# Patient Record
Sex: Female | Born: 1985 | Race: Black or African American | Hispanic: No | Marital: Single | Smoking: Never smoker
Health system: Southern US, Community
[De-identification: ages and names within clinical notes are randomized; demographics above are authoritative.]

---

## 2006-12-15 ENCOUNTER — Ambulatory Visit: Payer: Self-pay | Admitting: Internal Medicine

## 2007-08-08 ENCOUNTER — Ambulatory Visit: Payer: Self-pay | Admitting: Internal Medicine

## 2007-08-08 ENCOUNTER — Other Ambulatory Visit: Admission: RE | Admit: 2007-08-08 | Discharge: 2007-08-08 | Payer: Self-pay | Admitting: Internal Medicine

## 2007-08-08 ENCOUNTER — Encounter (INDEPENDENT_AMBULATORY_CARE_PROVIDER_SITE_OTHER): Payer: Self-pay | Admitting: Internal Medicine

## 2007-08-08 DIAGNOSIS — A5901 Trichomonal vulvovaginitis: Secondary | ICD-10-CM | POA: Insufficient documentation

## 2007-08-08 LAB — CONVERTED CEMR LAB
Glucose, Urine, Semiquant: NEGATIVE
Nitrite: NEGATIVE
Protein, U semiquant: 30
Urobilinogen, UA: NEGATIVE

## 2007-08-09 ENCOUNTER — Encounter (INDEPENDENT_AMBULATORY_CARE_PROVIDER_SITE_OTHER): Payer: Self-pay | Admitting: Internal Medicine

## 2007-08-09 LAB — CONVERTED CEMR LAB: Clue Cells Wet Prep HPF POC: NONE SEEN

## 2007-08-15 LAB — CONVERTED CEMR LAB
Chlamydia, DNA Probe: POSITIVE — AB
Cholesterol: 139 mg/dL (ref 0–200)
LDL Cholesterol: 71 mg/dL (ref 0–99)
Triglycerides: 43 mg/dL (ref <150)
VLDL: 9 mg/dL (ref 0–40)

## 2007-08-18 ENCOUNTER — Ambulatory Visit: Payer: Self-pay | Admitting: Internal Medicine

## 2007-08-18 DIAGNOSIS — B373 Candidiasis of vulva and vagina: Secondary | ICD-10-CM

## 2007-08-18 LAB — CONVERTED CEMR LAB: KOH Prep: NEGATIVE

## 2007-10-24 ENCOUNTER — Ambulatory Visit: Payer: Self-pay | Admitting: Internal Medicine

## 2008-01-09 ENCOUNTER — Ambulatory Visit: Payer: Self-pay | Admitting: Internal Medicine

## 2008-01-09 DIAGNOSIS — L708 Other acne: Secondary | ICD-10-CM

## 2008-01-26 ENCOUNTER — Telehealth (INDEPENDENT_AMBULATORY_CARE_PROVIDER_SITE_OTHER): Payer: Self-pay | Admitting: Internal Medicine

## 2008-02-01 ENCOUNTER — Ambulatory Visit: Payer: Self-pay | Admitting: Internal Medicine

## 2008-02-01 LAB — CONVERTED CEMR LAB: Beta hcg, urine, semiquantitative: NEGATIVE

## 2008-05-01 ENCOUNTER — Ambulatory Visit: Payer: Self-pay | Admitting: Internal Medicine

## 2008-07-26 ENCOUNTER — Ambulatory Visit: Payer: Self-pay | Admitting: Internal Medicine

## 2008-07-26 DIAGNOSIS — A184 Tuberculosis of skin and subcutaneous tissue: Secondary | ICD-10-CM | POA: Insufficient documentation

## 2008-07-29 ENCOUNTER — Ambulatory Visit: Payer: Self-pay | Admitting: Internal Medicine

## 2008-09-12 ENCOUNTER — Telehealth (INDEPENDENT_AMBULATORY_CARE_PROVIDER_SITE_OTHER): Payer: Self-pay | Admitting: Internal Medicine

## 2008-09-13 ENCOUNTER — Telehealth (INDEPENDENT_AMBULATORY_CARE_PROVIDER_SITE_OTHER): Payer: Self-pay | Admitting: Internal Medicine

## 2008-12-05 ENCOUNTER — Ambulatory Visit: Payer: Self-pay | Admitting: Internal Medicine

## 2008-12-05 LAB — CONVERTED CEMR LAB: Beta hcg, urine, semiquantitative: NEGATIVE

## 2009-04-10 ENCOUNTER — Emergency Department (HOSPITAL_COMMUNITY): Admission: EM | Admit: 2009-04-10 | Discharge: 2009-04-10 | Payer: Self-pay | Admitting: Emergency Medicine

## 2009-05-26 ENCOUNTER — Encounter (INDEPENDENT_AMBULATORY_CARE_PROVIDER_SITE_OTHER): Payer: Self-pay | Admitting: Internal Medicine

## 2009-08-15 ENCOUNTER — Ambulatory Visit: Payer: Self-pay | Admitting: Internal Medicine

## 2009-08-15 DIAGNOSIS — N949 Unspecified condition associated with female genital organs and menstrual cycle: Secondary | ICD-10-CM

## 2009-08-28 ENCOUNTER — Telehealth (INDEPENDENT_AMBULATORY_CARE_PROVIDER_SITE_OTHER): Payer: Self-pay | Admitting: Internal Medicine

## 2010-03-16 ENCOUNTER — Ambulatory Visit: Payer: Self-pay | Admitting: Internal Medicine

## 2010-03-16 DIAGNOSIS — N912 Amenorrhea, unspecified: Secondary | ICD-10-CM | POA: Insufficient documentation

## 2010-04-10 ENCOUNTER — Inpatient Hospital Stay (HOSPITAL_COMMUNITY): Admission: AD | Admit: 2010-04-10 | Discharge: 2010-04-10 | Payer: Self-pay | Admitting: Obstetrics

## 2010-04-10 ENCOUNTER — Ambulatory Visit: Payer: Self-pay | Admitting: Nurse Practitioner

## 2010-05-26 ENCOUNTER — Emergency Department (HOSPITAL_COMMUNITY): Admission: EM | Admit: 2010-05-26 | Discharge: 2010-05-26 | Payer: Self-pay | Admitting: Emergency Medicine

## 2010-06-02 ENCOUNTER — Ambulatory Visit (HOSPITAL_COMMUNITY): Admission: RE | Admit: 2010-06-02 | Discharge: 2010-06-02 | Payer: Self-pay | Admitting: Obstetrics

## 2010-09-28 ENCOUNTER — Emergency Department (HOSPITAL_COMMUNITY)
Admission: EM | Admit: 2010-09-28 | Discharge: 2010-09-28 | Payer: Self-pay | Source: Home / Self Care | Admitting: Emergency Medicine

## 2010-10-27 NOTE — Assessment & Plan Note (Signed)
Summary: PREGNANCY TEST///KT   Vital Signs:  Patient profile:   25 year old female Menstrual status:  irregular LMP:     02/17/2010 Weight:      148 pounds Temp:     97.9 degrees F Pulse rate:   85 / minute Pulse rhythm:   regular Resp:     18 per minute BP sitting:   105 / 72  (left arm) Cuff size:   regular  Vitals Entered By: Vesta Mixer CMA (March 16, 2010 3:43 PM) CC: would like pregnancy test, period is normally first if the month but last one was the end of May. Is Patient Diabetic? No Pain Assessment Patient in pain? no       Does patient need assistance? Ambulation Normal LMP (date): 02/17/2010 LMP - Character: 06/2007     Enter LMP: 02/17/2010   CC:  would like pregnancy test and period is normally first if the month but last one was the end of May..  History of Present Illness: 1.  Pt. would like a pregnancy test.  Not on any birth control other than using condoms.  States very good about using them.  Thinks perhaps a condom broke about 4 weeks ago.  Eating constantly.  Having breast tenderness.  Some cramping, but period has not started--was due end of May.  If is pregnant, plans to have child.  Pt. with current boyfriend for 5 months.  Allergies (verified): No Known Drug Allergies  Social History: Lives with Mom, Stepfather just moved out. Brothers live there as well. Soon to be moving to her own place. Has a boyfriend of 5 months.  Denies alcohol, tobacco, or drugs  Physical Exam  Abdomen:  soft, non-tender, normal bowel sounds, no distention, and no masses.  No palpably enlarge uterus at this time.   Impression & Recommendations:  Problem # 1:  AMENORRHEA (ICD-626.0)  Orders: Urine Pregnancy Test  (81191)  Positive--Prenatal vitamins and pamphlet to call OB given.  Complete Medication List: 1)  Prenatal Vitamins 0.8 Mg Tabs (Prenatal multivit-min-fe-fa) .Marland Kitchen.. 1 tab by mouth daily Prescriptions: PRENATAL VITAMINS 0.8 MG TABS (PRENATAL  MULTIVIT-MIN-FE-FA) 1 tab by mouth daily  #30 x 11   Entered and Authorized by:   Julieanne Manson MD   Signed by:   Julieanne Manson MD on 03/16/2010   Method used:   Electronically to        [facility] [phone]* (retail)       [address]       [place], [place]  [address]       Ph: [phone]       Fax: [phone]   RxID:   [id number]   Appended Document: PREGNANCY TEST///KT  Laboratory Results   Urine Tests      Urine HCG: positive

## 2010-12-11 LAB — URINALYSIS, ROUTINE W REFLEX MICROSCOPIC
Ketones, ur: NEGATIVE mg/dL
Nitrite: NEGATIVE
Protein, ur: NEGATIVE mg/dL
Specific Gravity, Urine: 1.023 (ref 1.005–1.030)
pH: 7 (ref 5.0–8.0)

## 2010-12-11 LAB — URINE MICROSCOPIC-ADD ON

## 2010-12-11 LAB — POCT PREGNANCY, URINE: Preg Test, Ur: POSITIVE

## 2010-12-12 ENCOUNTER — Inpatient Hospital Stay (HOSPITAL_COMMUNITY)
Admission: AD | Admit: 2010-12-12 | Discharge: 2010-12-12 | Disposition: A | Discharge status: Home / Self Care | Payer: Medicaid Other | Source: Ambulatory Visit | Attending: Obstetrics | Admitting: Obstetrics

## 2010-12-12 DIAGNOSIS — R109 Unspecified abdominal pain: Secondary | ICD-10-CM

## 2010-12-12 DIAGNOSIS — O98819 Other maternal infectious and parasitic diseases complicating pregnancy, unspecified trimester: Secondary | ICD-10-CM

## 2010-12-12 DIAGNOSIS — A5901 Trichomonal vulvovaginitis: Secondary | ICD-10-CM

## 2010-12-12 LAB — ABO/RH: ABO/RH(D): O POS

## 2010-12-12 LAB — WET PREP, GENITAL

## 2010-12-12 LAB — CBC
HCT: 40.2 % (ref 36.0–46.0)
MCH: 29.1 pg (ref 26.0–34.0)
MCHC: 33.1 g/dL (ref 30.0–36.0)
RDW: 12.8 % (ref 11.5–15.5)

## 2010-12-12 LAB — URINALYSIS, ROUTINE W REFLEX MICROSCOPIC

## 2010-12-12 LAB — GC/CHLAMYDIA PROBE AMP, GENITAL
GC Probe Amp, Genital: NEGATIVE
GC Probe Amp, Genital: NEGATIVE

## 2010-12-12 LAB — URINE MICROSCOPIC-ADD ON

## 2010-12-12 LAB — POCT PREGNANCY, URINE: Preg Test, Ur: POSITIVE

## 2011-01-04 LAB — POCT PREGNANCY, URINE: Preg Test, Ur: NEGATIVE

## 2011-01-10 ENCOUNTER — Inpatient Hospital Stay (HOSPITAL_COMMUNITY)
Admission: AD | Admit: 2011-01-10 | Discharge: 2011-01-10 | Disposition: A | Discharge status: Home / Self Care | Payer: Medicaid Other | Source: Ambulatory Visit | Attending: Obstetrics | Admitting: Obstetrics

## 2011-01-10 DIAGNOSIS — O479 False labor, unspecified: Secondary | ICD-10-CM | POA: Insufficient documentation

## 2011-01-15 ENCOUNTER — Inpatient Hospital Stay (HOSPITAL_COMMUNITY)
Admission: AD | Admit: 2011-01-15 | Discharge: 2011-01-18 | DRG: 765 | Disposition: A | Discharge status: Home / Self Care | Payer: Medicaid Other | Source: Ambulatory Visit | Attending: Obstetrics | Admitting: Obstetrics

## 2011-01-15 DIAGNOSIS — D62 Acute posthemorrhagic anemia: Secondary | ICD-10-CM | POA: Diagnosis not present

## 2011-01-15 DIAGNOSIS — O9903 Anemia complicating the puerperium: Secondary | ICD-10-CM | POA: Diagnosis not present

## 2011-01-15 LAB — CBC
HCT: 36.8 % (ref 36.0–46.0)
MCHC: 31.8 g/dL (ref 30.0–36.0)
MCV: 85.8 fL (ref 78.0–100.0)
RBC: 4.29 MIL/uL (ref 3.87–5.11)
RDW: 14.5 % (ref 11.5–15.5)
WBC: 16.1 10*3/uL — ABNORMAL HIGH (ref 4.0–10.5)

## 2011-01-17 LAB — CBC
HCT: 27.8 % — ABNORMAL LOW (ref 36.0–46.0)
Hemoglobin: 8.8 g/dL — ABNORMAL LOW (ref 12.0–15.0)
MCH: 27 pg (ref 26.0–34.0)
MCV: 85.3 fL (ref 78.0–100.0)
RBC: 3.26 MIL/uL — ABNORMAL LOW (ref 3.87–5.11)

## 2011-01-26 ENCOUNTER — Inpatient Hospital Stay (HOSPITAL_COMMUNITY): Admission: AD | Admit: 2011-01-26 | Payer: Self-pay | Source: Home / Self Care | Admitting: Obstetrics

## 2011-01-26 NOTE — Op Note (Signed)
Kimberly Fleming, MOLZAHN                 ACCOUNT NO.:  [id number]  MEDICAL RECORD NO.:  [id number]           PATIENT TYPE:  I  LOCATION:  9124                          [facility]ACILITY:  [facility]  PHYSICIAN:  Roseanna Rainbow, M.D.DATE OF BIRTH:  [birth year]  DATE OF PROCEDURE:  01/16/2011 DATE OF DISCHARGE:                              OPERATIVE REPORT   PREOPERATIVE DIAGNOSES:  Intrauterine pregnancy at term, arrest of descent, failed attempt at a vacuum-assisted delivery.  POSTOPERATIVE DIAGNOSES:  Intrauterine pregnancy at term, arrest of descent, failed attempt at a vacuum-assisted delivery, persistent occiput posterior.  PROCEDURE:  Primary low uterine flap elliptical cesarean delivery via transverse incision.  SURGEON:  Roseanna Rainbow, MD  ANESTHESIA:  Spinal.  FINDINGS:  Live born female, occiput posterior, Apgars 8 and 9.  ESTIMATED BLOOD LOSS:  600 mL.  URINE OUTPUT:  As per Anesthesiology, bloody.  COMPLICATIONS:  Right lateral uterine incision extension.  PROCEDURE IN DETAIL:  The patient was taken to the operating room with an IV running.  A spinal anesthetic was then administered.  The patient was then placed in the dorsal supine position with a left lateral tilt and prepped and draped in usual sterile fashion.  A transverse skin incision was then made with a scalpel and extended down to the fascia with the Bovie.  The fascia was nicked in the midline.  The fascial incision was then extended bilaterally with curved Mayo scissors.  The superior aspect of the fascial incision was tented up and the underlying rectus muscles dissected off.  The inferior aspect of the fascial incision was manipulated in a similar fashion.  The rectus muscles were separated in the midline.  The parietal peritoneum was tented up and entered sharply.  This incision was then extended superiorly and inferiorly with good visualization of the bladder.  The bladder blade was then placed.  The  vesicouterine peritoneum was tented up and entered sharply.  This incision was then extended bilaterally and the bladder flap created bluntly.  The bladder blade was then replaced.  The lower uterine segment was incised in a transverse fashion with the scalpel. The shoulders were then elevated and with assistance from below, the infant's head was delivered atraumatically.  The cord was clamped.  The infant was handed off to the awaiting neonatologist.  The placenta was then removed.  The intrauterine cavity was evacuated of any remaining amniotic fluid clots and debris with a moistened laparotomy sponge.  The uterine incision was then reapproximated in a running interlocking fashion using a suture of 0 Monocryl.  The extension noted above was approximately 2 cm, and it was repaired separately and imbricated as well.  The remainder of the uterine incision was imbricated with a suture of 0 Monocryl.  Adequate hemostasis was noted.  The paracolic gutters were then irrigated.  The parietal peritoneum was reapproximated in a running fashion using a suture of 2-0 Vicryl.  The fascia was reapproximated with 2 running sutures of 0 Vicryl tied in the midline.  The skin was closed with staples.  At the close of the procedure, the instrument and pack counts  were said to be correct x2.  The patient was taken to the PACU in awake and stable condition.     Roseanna Rainbow, M.D.     Judee Clara  D:  01/16/2011  T:  01/17/2011  Job:  [id number]  Electronically Signed by Antionette Char M.D. on 01/26/2011 03:30:41 PM

## 2011-01-26 NOTE — H&P (Signed)
  NAMEZELENA, Kimberly Fleming                 ACCOUNT NO.:  [id number]  MEDICAL RECORD NO.:  [id number]           PATIENT TYPE:  I  LOCATION:  9124                          [facility]  [facility]  PHYSICIAN:  Roseanna Rainbow, M.D.DATE OF BIRTH:  12/29/1985  DATE OF ADMISSION:  01/15/2011 DATE OF DISCHARGE:                             HISTORY & PHYSICAL   CHIEF COMPLAINT:  The patient is a 25 year old gravida 1, para 0 with an intrauterine pregnancy at 39 plus weeks complaining of contractions.  HISTORY OF PRESENT ILLNESS:  Please see the above.  She denies rupture of membranes.  SOCIAL HISTORY:  She is single.  She denies any tobacco, ethanol, or drug use.  ALLERGIES:  No known drug allergies.  PAST GYN HISTORY:  Normal triad.  There is a history of Chlamydia.  PAST MEDICAL HISTORY:  There is a remote history of a fractured left upper extremity.  PAST SURGICAL HISTORY:  She denies family history.  She denies prenatal care onset of care with Dr. Gaynell Face at 6 weeks' gestation.  PRENATAL LABS:  Hemoglobin 12.9, hematocrit 40, platelets 231,000, blood type O+, antibody screen negative.  Sickle cell trait negative.  RPR nonreactive, rubella immune.  Hepatitis B surface antigen negative, HIV nonreactive, Pap negative, GC negative.  Chlamydia probe positive.  Quad screen negative, GBS negative on December 17, 2010.  Chlamydia negative on December 17, 2010.  Ultrasound on June 02, 2010, At 6 weeks 5 days, [facility] January 21, 2011. Ultrasound on August 28, 2010, at 20 weeks anterior placenta, no previa.  OB RISK FACTORS:  Positive Chlamydia probe.  MEDICATIONS:  Please see the medication reconciliation form.  REVIEW OF SYSTEMS:  GU: Please see the above.  PHYSICAL EXAMINATION:  VITAL SIGNS:  Stable and afebrile.  PHYSICAL EXAMINATION:  VITAL SIGNS:  Stable, afebrile.  Fetal heart tracing baseline 140.  Moderate long-term variability.  Tocodynamometer uterine contractions every 2-4  minutes. PELVIC:  Sterile vaginal exam per the RN, the cervix is 3 cm dilated, 70% effaced.  ASSESSMENT:  Nullipara at term, early labor.  Category 1 fetal heart tracing.  PLAN:  Admission expectant management, anticipate a spontaneous vaginal delivery.     Roseanna Rainbow, M.D.     Judee Clara  D:  01/16/2011  T:  01/16/2011  Job:  [id number]  cc:   Kathreen Cosier, M.D. Fax: [phone]  Electronically Signed by Antionette Char M.D. on 01/26/2011 03:30:29 PM

## 2011-02-17 NOTE — Discharge Summary (Signed)
  NAMESEMA, STANGLER                 ACCOUNT NO.:  [id number]  MEDICAL RECORD NO.:  [id number]           PATIENT TYPE:  I  LOCATION:  9124                          [facility]ILITY:  [facility]  PHYSICIAN:  Kathreen Cosier, M.D.DATE OF BIRTH:  21-Feb-1986  DATE OF ADMISSION:  01/15/2011 DATE OF DISCHARGE:  01/18/2011                              DISCHARGE SUMMARY   HISTORY OF PRESENT ILLNESS:  The patient is a 25 year old primigravida at 64 weeks who was admitted in labor.  The patient progressed satisfactorily labor and pushed for 2 hours, then the vacuum was applied by Dr. Tamela Oddi and delivery was not detected, so the patient underwent a primary low transverse cesarean section for failure to progress in labor.  Postoperatively,  Dictation ended at this point.          ______________________________ Kathreen Cosier, M.D.     BAM/MEDQ  D:  02/10/2011  T:  02/10/2011  Job:  [id number]  Electronically Signed by Francoise Ceo M.D. on 02/17/2011 10:13:09 AM

## 2011-02-17 NOTE — Discharge Summary (Signed)
  [name], Kimberly Fleming                 ACCOUNT NO.:  [id number]  MEDICAL RECORD NO.:  [id number]           PATIENT TYPE:  I  LOCATION:  9124                          [facility]  PHYSICIAN:  Kathreen Cosier, M.D.DATE OF BIRTH:  [birth year]  DATE OF ADMISSION:  01/15/2011 DATE OF DISCHARGE:  01/18/2011                              DISCHARGE SUMMARY   The patient is a 25 year old primigravida at term who was admitted in labor and progressed satisfactorily to full dilatation.  She pushed for 2 hours, and then the vacuum was applied by Dr. Tamela Oddi and delivery was not effective, so she delivered by C-section. Postoperatively, she did well and on January 18, 2011, postop day #2, desired early discharge.  The patient was discharged on the second postoperative day, ambulatory, on a regular diet, negative RPR negative HIV, to see me in 6 weeks.  DISCHARGE DIAGNOSIS:  Status post primary low transverse cesarean section at term for failure to progress in labor.          ______________________________ Kathreen Cosier, M.D.     BAM/MEDQ  D:  02/10/2011  T:  02/10/2011  Job:  [id number]  Electronically Signed by Francoise Ceo M.D. on 02/17/2011 10:13:11 AM

## 2011-03-07 IMAGING — US US OB COMP LESS 14 WK
1 series · 14 of 28 positions shown · non-contrast
Comparison: none

OBSTETRICAL ULTRASOUND:
 This ultrasound exam was performed in the [HOSPITAL] Ultrasound Department.  The OB US report was generated in the AS system, and faxed to the ordering physician.  This report is also available in [HOSPITAL]?s [employer] and in [REDACTED] PACS.

[Series 1: us ob comp less 14 wks · 14 of 37 slices shown]
[im 2/37]
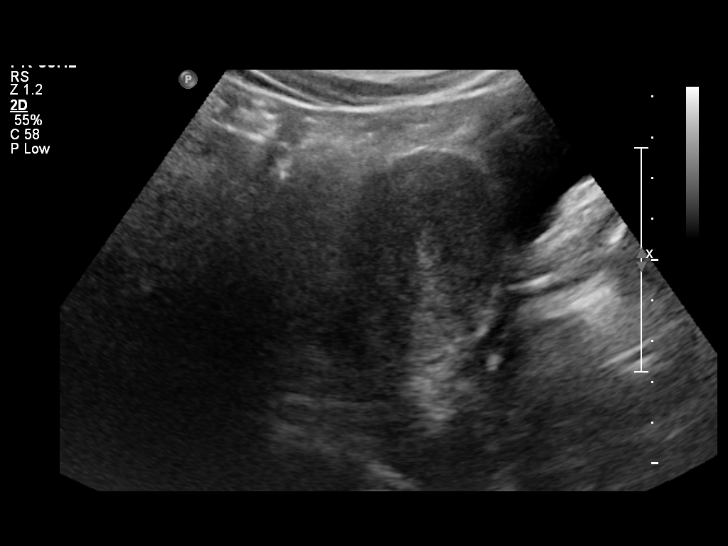
[im 5/37]
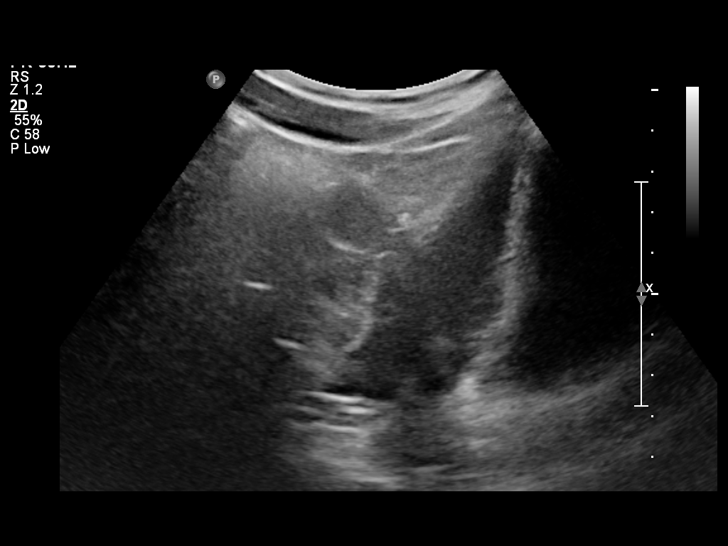
[im 7/37]
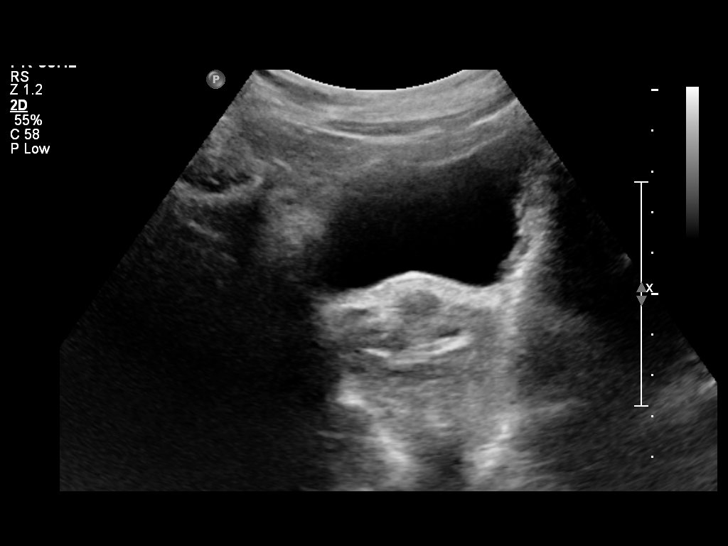
[im 10/37]
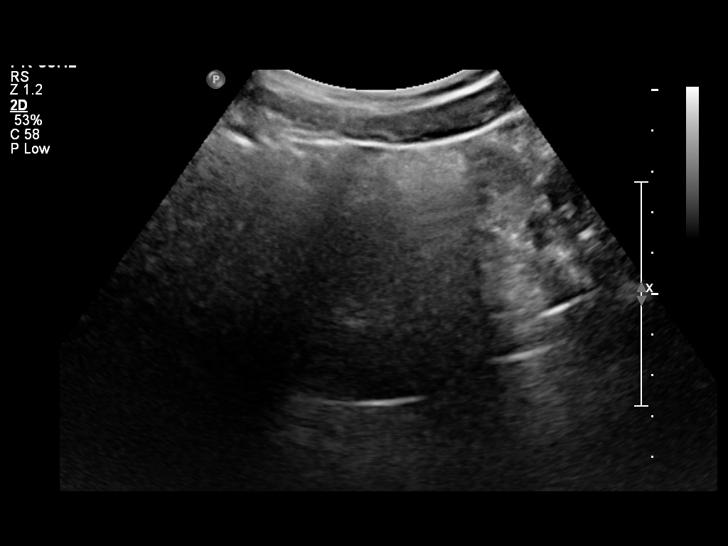
[im 13/37]
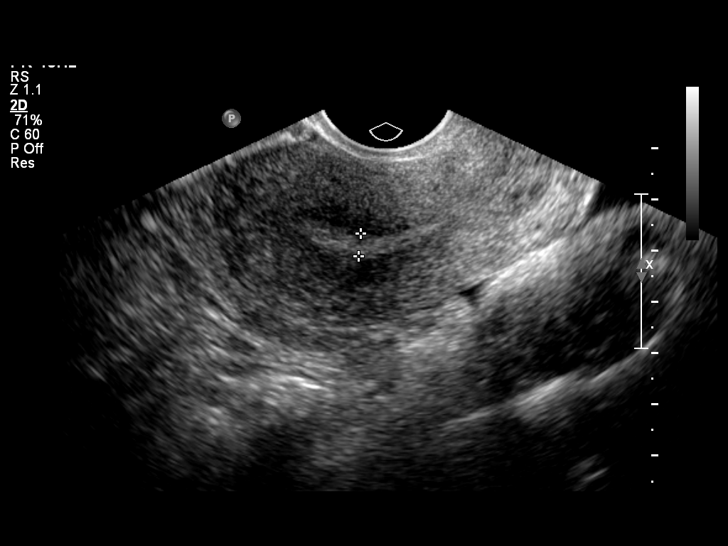
[im 15/37]
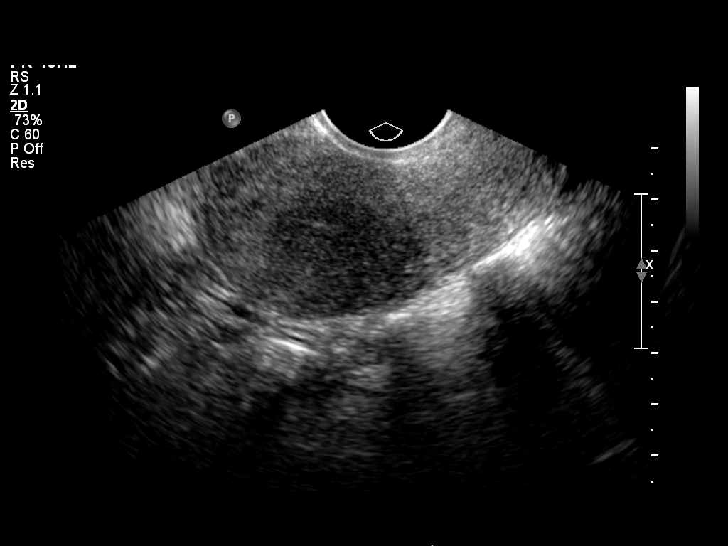
[im 18/37]
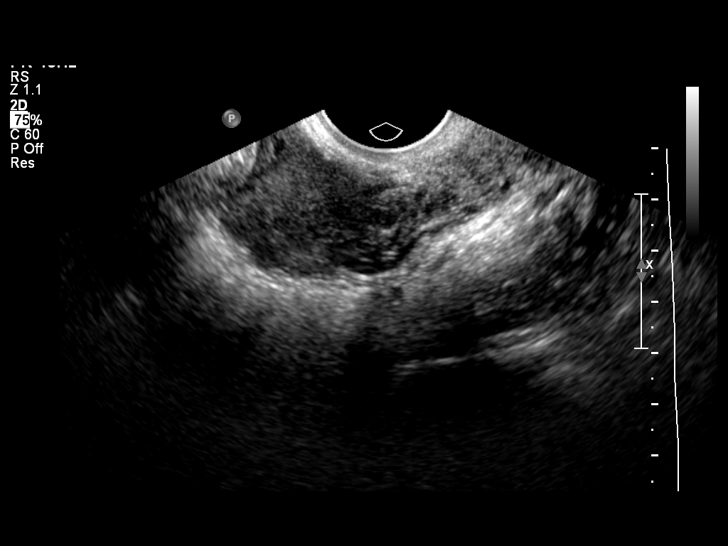
[im 21/37]
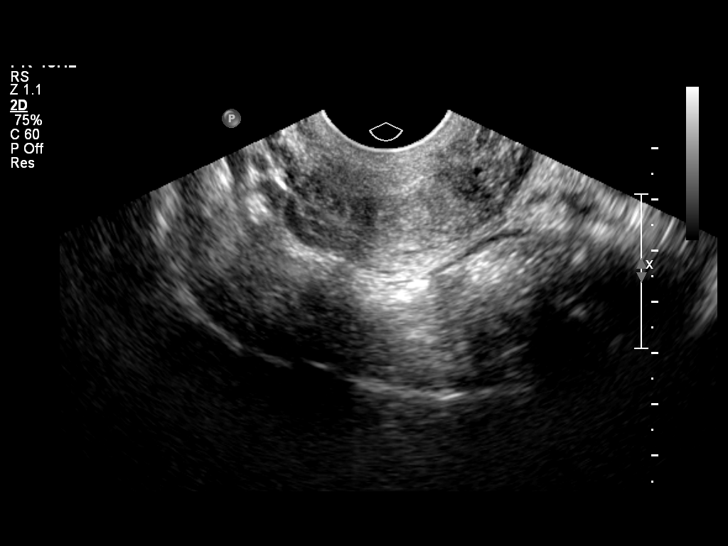
[im 23/37]
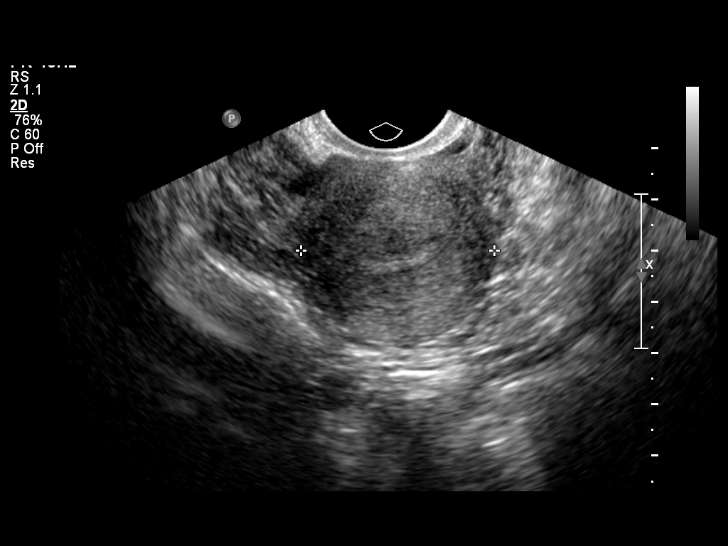
[im 26/37]
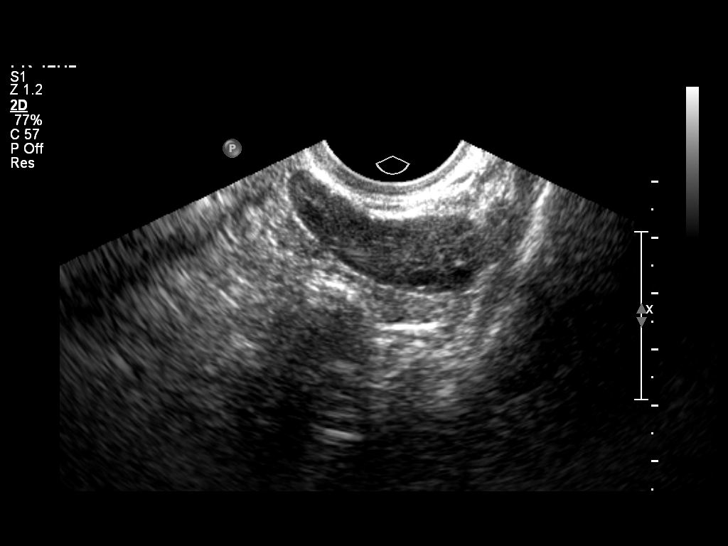
[im 29/37]
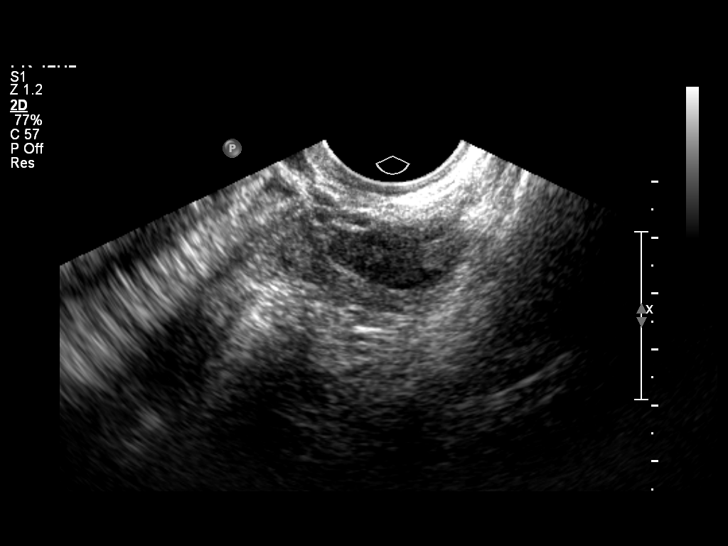
[im 31/37]
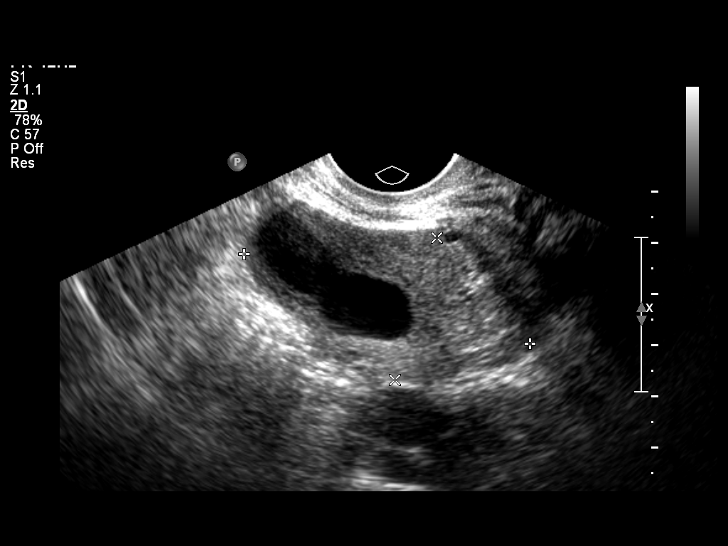
[im 34/37]
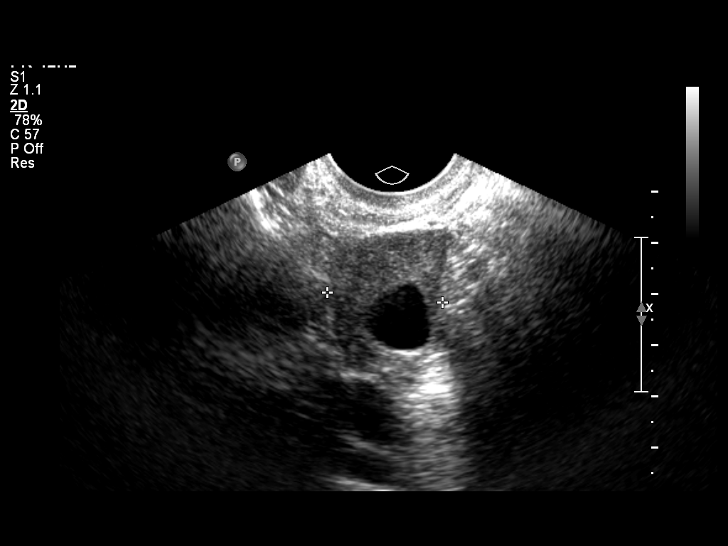
[im 37/37]
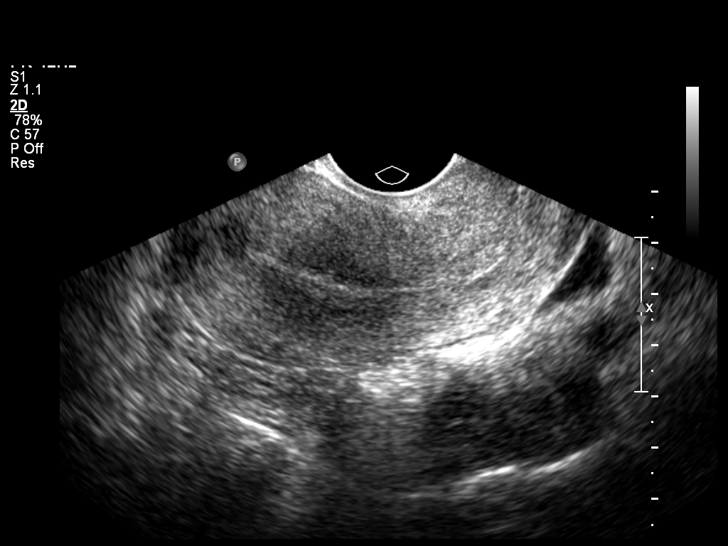

[14 of 28 positions shown; findings below may reference images not displayed]

IMPRESSION: See AS Obstetric US report.

## 2015-09-23 LAB — PROCEDURE REPORT - SCANNED: PAP SMEAR: NEGATIVE

## 2017-04-05 ENCOUNTER — Ambulatory Visit: Payer: Medicaid Other | Admitting: Obstetrics & Gynecology

## 2017-05-17 ENCOUNTER — Ambulatory Visit: Payer: Self-pay | Admitting: Obstetrics & Gynecology

## 2017-06-03 ENCOUNTER — Ambulatory Visit: Payer: Medicaid Other | Admitting: Obstetrics

## 2023-02-17 ENCOUNTER — Encounter (HOSPITAL_COMMUNITY): Payer: Self-pay | Admitting: Emergency Medicine

## 2023-02-17 ENCOUNTER — Ambulatory Visit (HOSPITAL_COMMUNITY)
Admission: EM | Admit: 2023-02-17 | Discharge: 2023-02-17 | Disposition: A | Discharge status: Home / Self Care | Payer: Medicaid Other | Attending: Emergency Medicine | Admitting: Emergency Medicine

## 2023-02-17 DIAGNOSIS — J302 Other seasonal allergic rhinitis: Secondary | ICD-10-CM

## 2023-02-17 MED ORDER — CETIRIZINE HCL 10 MG PO TABS: 10.0000 mg | ORAL_TABLET | Freq: Every day | ORAL | 2 refills | Status: DC

## 2023-02-17 MED ORDER — IBUPROFEN 800 MG PO TABS: 800.0000 mg | ORAL_TABLET | Freq: Three times a day (TID) | ORAL | 0 refills | Status: AC

## 2023-02-17 MED ORDER — OLOPATADINE HCL 0.1 % OP SOLN: 1.0000 [drp] | Freq: Two times a day (BID) | OPHTHALMIC | 2 refills | Status: AC

## 2023-02-17 NOTE — ED Provider Notes (Signed)
[facility]    CSN: [id number] Arrival date & time: 02/17/23  1347      History   Chief Complaint Chief Complaint  Patient presents with   Headache   Eye Drainage    HPI Kimberly Fleming is a 37 y.o. female.  Here with a few day history of watery itchy eyes, slight runny nose, headaches. Took benadryl last night and zyrtec this morning Went to [facility] yesterday for eye exam. They thought it was allergies No vision changes. No fever, sore throat, ear pain, cough, abd pain, NVD, weakness  History reviewed. No pertinent past medical history.  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 No pertinent surgical history.  OB History   No obstetric history on file.      Home Medications    Prior to Admission medications   Medication Sig Start Date End Date Taking? Authorizing Provider  cetirizine (ZYRTEC ALLERGY) 10 MG tablet Take 1 tablet (10 mg total) by mouth daily. 02/17/23  Yes Warwick Nick, Lurena Joiner, PA-C  ibuprofen (ADVIL) 800 MG tablet Take 1 tablet (800 mg total) by mouth 3 (three) times daily. 02/17/23  Yes Jenee Spaugh, Lurena Joiner, PA-C  olopatadine (PATANOL) 0.1 % ophthalmic solution Place 1 drop into both eyes 2 (two) times daily. 02/17/23  Yes Jayliana Valencia, Ray Church    Family History History reviewed. No pertinent family history.  Social History Social History   Tobacco Use   Smoking status: Unknown     Allergies   Patient has no known allergies.   Review of Systems Review of Systems As per HPI  Physical Exam Triage Vital Signs ED Triage Vitals [02/17/23 1432]  Enc Vitals Group     BP (!) 130/90     Pulse Rate 94     Resp 18     Temp 98.6 F (37 C)     Temp Source Oral     SpO2 97 %     Weight      Height       Head Circumference      Peak Flow      Pain Score 7     Pain Loc      Pain Edu?      Excl. in GC?    No data found.  Updated Vital Signs BP (!) 130/90 (BP Location: Left Arm)   Pulse 94   Temp 98.6 F (37 C) (Oral)   Resp 18   SpO2 97%   Visual Acuity Right Eye Distance:   Left Eye Distance:   Bilateral Distance:    Right Eye Near:   Left Eye Near:    Bilateral Near:     Physical Exam Vitals and nursing note reviewed.  Constitutional:      General: She is not in acute distress.    Appearance: She is not ill-appearing.  HENT:     Right Ear: Tympanic membrane and ear canal normal.     Left Ear: Tympanic membrane and ear canal normal.     Nose: No congestion or rhinorrhea.     Mouth/Throat:     Mouth: Mucous membranes are moist.     Pharynx: Oropharynx is clear. No posterior oropharyngeal erythema.  Eyes:     Conjunctiva/sclera: Conjunctivae normal.  Cardiovascular:  Rate and Rhythm: Normal rate and regular rhythm.     Pulses: Normal pulses.     Heart sounds: Normal heart sounds.  Pulmonary:     Effort: Pulmonary effort is normal.     Breath sounds: Normal breath sounds.  Musculoskeletal:     Cervical back: Normal range of motion.  Lymphadenopathy:     Cervical: No cervical adenopathy.  Skin:    General: Skin is warm and dry.  Neurological:     Mental Status: She is alert and oriented to person, place, and time.      UC Treatments / Results  Labs (all labs ordered are listed, but only abnormal results are displayed) Labs Reviewed - No data to display  EKG   Radiology No results found.  Procedures Procedures (including critical care time)  Medications Ordered in UC Medications - No data to display  Initial Impression / Assessment and Plan / UC Course  I have reviewed the triage vital signs and the nursing notes.  Pertinent labs & imaging results that were available during my care of the patient were reviewed by me and considered in my  medical decision making (see chart for details).  Well-appearing.  Agree that this may be seasonal allergies versus start of a virus.  Recommend she start taking daily Zyrtec.  Sent eyedrops to use twice daily as needed.  Advised to try Tylenol and or ibuprofen for headache, increase fluids, rest.  No red flags. Recommend return if she is having persisting symptoms.  No questions at this time  Final Clinical Impressions(s) / UC Diagnoses   Final diagnoses:  Seasonal allergies     Discharge Instructions      Daily zyrtec, every day for the next few weeks You can use the eye drops twice daily  Tylenol and ibuprofen can be used for headache. You can alternate them every 4-6 hours Get lots of rest and drink a lot of fluids!  You can return if needed    ED Prescriptions     Medication Sig Dispense Auth. Provider   cetirizine (ZYRTEC ALLERGY) 10 MG tablet Take 1 tablet (10 mg total) by mouth daily. 30 tablet Aideen Fenster, PA-C   ibuprofen (ADVIL) 800 MG tablet Take 1 tablet (800 mg total) by mouth 3 (three) times daily. 21 tablet Alyssah Algeo, PA-C   olopatadine (PATANOL) 0.1 % ophthalmic solution Place 1 drop into both eyes 2 (two) times daily. 5 mL Amardeep Beckers, Lurena Joiner, PA-C      PDMP not reviewed this encounter.   Avonlea Sima, Lurena Joiner, [place] 02/17/23 1521

## 2023-02-17 NOTE — ED Triage Notes (Signed)
Pt reports watery red eyes, head congestion, light headed. Denies SOB. States she went to [facility] and had her eyes checked and was told symptoms were related to allergies.

## 2023-02-17 NOTE — Discharge Instructions (Addendum)
Daily zyrtec, every day for the next few weeks You can use the eye drops twice daily  Tylenol and ibuprofen can be used for headache. You can alternate them every 4-6 hours Get lots of rest and drink a lot of fluids!  You can return if needed

## 2023-04-06 ENCOUNTER — Encounter (HOSPITAL_BASED_OUTPATIENT_CLINIC_OR_DEPARTMENT_OTHER): Payer: Self-pay | Admitting: Emergency Medicine

## 2023-04-06 ENCOUNTER — Emergency Department (HOSPITAL_BASED_OUTPATIENT_CLINIC_OR_DEPARTMENT_OTHER)
Admission: EM | Admit: 2023-04-06 | Discharge: 2023-04-07 | Discharge status: Against Medical Advice | Payer: Medicaid Other | Source: Home / Self Care

## 2023-04-06 ENCOUNTER — Emergency Department (HOSPITAL_BASED_OUTPATIENT_CLINIC_OR_DEPARTMENT_OTHER)
Admission: EM | Admit: 2023-04-06 | Discharge: 2023-04-06 | Discharge status: Against Medical Advice | Payer: Medicaid Other | Attending: Emergency Medicine | Admitting: Emergency Medicine

## 2023-04-06 ENCOUNTER — Other Ambulatory Visit: Payer: Self-pay

## 2023-04-06 DIAGNOSIS — Z5321 Procedure and treatment not carried out due to patient leaving prior to being seen by health care provider: Secondary | ICD-10-CM | POA: Insufficient documentation

## 2023-04-06 DIAGNOSIS — R519 Headache, unspecified: Secondary | ICD-10-CM | POA: Insufficient documentation

## 2023-04-06 NOTE — ED Notes (Signed)
Per RT pt left AMA because she has to pick up her son, provider notified. Pt left before signing AMA form or talking to provider. Marland Kitchen

## 2023-04-06 NOTE — ED Triage Notes (Signed)
Pt arrives pov, steady gait with c/o head/scalp tenderness x 2 days. Referred by UC for inflammation, tx with abx for same

## 2023-04-06 NOTE — ED Triage Notes (Addendum)
Pt arrives pov, steady gait with c/o head/scalp tenderness x 2 days. Referred by UC for inflammation, tx with abx for same    

## 2023-04-07 ENCOUNTER — Other Ambulatory Visit: Payer: Self-pay

## 2023-04-07 ENCOUNTER — Emergency Department (HOSPITAL_COMMUNITY)
Admission: EM | Admit: 2023-04-07 | Discharge: 2023-04-07 | Disposition: A | Discharge status: Home / Self Care | Payer: Medicaid Other | Attending: Emergency Medicine | Admitting: Emergency Medicine

## 2023-04-07 ENCOUNTER — Encounter (HOSPITAL_COMMUNITY): Payer: Self-pay | Admitting: Emergency Medicine

## 2023-04-07 DIAGNOSIS — R519 Headache, unspecified: Secondary | ICD-10-CM | POA: Diagnosis present

## 2023-04-07 MED ORDER — KETOROLAC TROMETHAMINE 30 MG/ML IJ SOLN
30.0000 mg | Freq: Once | INTRAMUSCULAR | Status: AC
  Administered 2023-04-07: 30 mg via INTRAMUSCULAR
  Filled 2023-04-07: qty 1

## 2023-04-07 NOTE — Discharge Instructions (Signed)
May use Aquaphor as needed to the skin on her scalp  Tylenol or Motrin as needed for pain  Complete the antibiotics prescribed to you by urgent care  Return for new or worsening symptoms

## 2023-04-07 NOTE — ED Triage Notes (Signed)
Pt endorses scalp pain for 2 days. Believes it has to do with infection from braids. Reports hx of infection to scalp awhile back and given oral abx.

## 2023-04-07 NOTE — ED Provider Notes (Signed)
[place] EMERGENCY DEPARTMENT AT [facility] Provider Note   CSN: [id number] Arrival date & time: 04/07/23  1617    History  Chief Complaint  Patient presents with   Headache    Kimberly Fleming is a 37 y.o. female here for scalp pain.  Previously urgent care prescribed antibiotics for possible cellulitis at end of May.  Patient did not complete the antibiotics, intermittently takes them whenever her scalp "flares."  No numbness, weakness.  Occasionally has pruritus to the area.  Feels an odd sensation to the skin on her scalp.  Symptoms are worse when she had braids in her hair which she has removed.  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  No sudden onset headache, numbness, weakness, swelling to scalp.  No recent trauma or injury.  Occasionally has headaches.  No fever, new rashes, lesions, blurred vision, diplopia.  HPI     Home Medications Prior to Admission medications   Medication Sig Start Date End Date Taking? Authorizing Provider  acetaminophen (TYLENOL) 500 MG tablet Take 500 mg by mouth every 6 (six) hours as needed for moderate pain.   Yes [provider]  cetirizine (ZYRTEC ALLERGY) 10 MG tablet Take 1 tablet (10 mg total) by mouth daily. Patient not taking: Reported on 04/07/2023 02/17/23   Rising, Lurena Joiner, PA-C  ibuprofen (ADVIL) 800 MG tablet Take 1 tablet (800 mg total) by mouth 3 (three) times daily. Patient not taking: Reported on 04/07/2023 02/17/23   Rising, Lurena Joiner, PA-C  olopatadine (PATANOL) 0.1 % ophthalmic solution Place 1 drop into both eyes 2 (two) times daily. Patient not taking: Reported on 04/07/2023 02/17/23   Rising, Lurena Joiner, PA-C      Allergies    Patient has no known allergies.    Review of Systems   Review of Systems  Constitutional: Negative.   HENT: Negative.    Respiratory: Negative.    Cardiovascular: Negative.   Gastrointestinal: Negative.   Genitourinary: Negative.    Musculoskeletal: Negative.   Skin: Negative.   Neurological: Negative.   All other systems reviewed and are negative.   Physical Exam Updated Vital Signs BP 127/89 (BP Location: Right Arm)   Pulse 89   Temp 97.9 F (36.6 C) (Oral)   Resp 18   Ht 5\' 8"  (1.727 m)   Wt 89 kg   LMP 03/12/2023   SpO2 97%   BMI 29.83 kg/m  Physical Exam Vitals and nursing note reviewed.  Constitutional:      General: She is not in acute distress.    Appearance: She is well-developed. She is not ill-appearing, toxic-appearing or diaphoretic.  HENT:     Head: Normocephalic and atraumatic.     Jaw: There is normal jaw occlusion.     Comments: Areas of missing hair to occipital region of scalp.  No erythema, fluctuance, induration, rashes, lesions.    Mouth/Throat:     Mouth: Mucous membranes are moist.  Eyes:     General: No visual field deficit.    Extraocular Movements: Extraocular movements intact.     Conjunctiva/sclera: Conjunctivae normal.     Pupils: Pupils are equal, round, and reactive to light.  Cardiovascular:     Rate and Rhythm: Normal rate.     Heart sounds: Normal heart sounds.  Pulmonary:     Effort: Pulmonary effort is normal. No respiratory distress.     Breath sounds: Normal breath sounds.  Abdominal:     General:  Bowel sounds are normal. There is no distension.     Palpations: Abdomen is soft.  Musculoskeletal:        General: No swelling or tenderness. Normal range of motion.     Cervical back: Normal range of motion.  Skin:    General: Skin is warm and dry.     Capillary Refill: Capillary refill takes less than 2 seconds.  Neurological:     General: No focal deficit present.     Mental Status: She is alert and oriented to person, place, and time.     Cranial Nerves: Cranial nerves 2-12 are intact. No cranial nerve deficit.     Sensory: Sensation is intact. No sensory deficit.     Motor: Motor function is intact. No weakness.     Coordination: Coordination is  intact.     Gait: Gait is intact.  Psychiatric:        Mood and Affect: Mood normal.     ED Results / Procedures / Treatments   Labs (all labs ordered are listed, but only abnormal results are displayed) Labs Reviewed - No data to display  EKG None  Radiology No results found.  Procedures Procedures    Medications Ordered in ED Medications  ketorolac (TORADOL) 30 MG/ML injection 30 mg (30 mg Intramuscular Given 04/07/23 1904)    ED Course/ Medical Decision Making/ A&P   Patient here for evaluation of intermittent scalp pain.  Previously diagnosed by urgent care end of May with possible cellulitis after having braids in her hair.  Intermittently takes antibiotics when her symptoms "flare.".  Some intermittent pruritus.  Has a nonfocal neuroexam without deficits.  She has no obvious infectious process, no erythema, warmth, fluctuance, induration, rashes or lesions.  She does have some areas of what appear to be alopecia which she states she has had a child from prior ringworm infection.  She has no evidence of tinea on scalp at this time.  No abscess requiring drainage.  No obvious cellulitis.  Unclear etiology of her symptoms.  Low suspicion for acute neurologic/neurosurgical emergency, low suspicion for infectious process.  Discussed symptomatic management, follow-up outpatient, PCP follow-up possible Derm if still having symptoms.  Discussed OTC medications, return for worsening symptoms.  The patient has been appropriately medically screened and/or stabilized in the ED. I have low suspicion for any other emergent medical condition which would require further screening, evaluation or treatment in the ED or require inpatient management.  Patient is hemodynamically stable and in no acute distress.  Patient able to ambulate in department prior to ED.  Evaluation does not show acute pathology that would require ongoing or additional emergent interventions while in the emergency  department or further inpatient treatment.  I have discussed the diagnosis with the patient and answered all questions.  Pain is been managed while in the emergency department and patient has no further complaints prior to discharge.  Patient is comfortable with plan discussed in room and is stable for discharge at this time.  I have discussed strict return precautions for returning to the emergency department.  Patient was encouraged to follow-up with PCP/specialist refer to at discharge.                             Medical Decision Making Amount and/or Complexity of Data Reviewed External Data Reviewed: labs, radiology and notes.  Risk OTC drugs. Prescription drug management. Parenteral controlled substances. Decision regarding hospitalization. Diagnosis or treatment  significantly limited by social determinants of health.          Final Clinical Impression(s) / ED Diagnoses Final diagnoses:  Scalp pain    Rx / DC Orders ED Discharge Orders     None         Ximena Todaro A, PA-C 04/08/23 1405    Linwood Dibbles, MD 04/08/23 1609

## 2023-08-05 ENCOUNTER — Other Ambulatory Visit: Payer: Self-pay

## 2023-08-05 ENCOUNTER — Emergency Department (HOSPITAL_COMMUNITY)
Admission: EM | Admit: 2023-08-05 | Discharge: 2023-08-05 | Disposition: A | Discharge status: Home / Self Care | Payer: Medicaid Other | Attending: Emergency Medicine | Admitting: Emergency Medicine

## 2023-08-05 DIAGNOSIS — H53149 Visual discomfort, unspecified: Secondary | ICD-10-CM | POA: Diagnosis not present

## 2023-08-05 DIAGNOSIS — R519 Headache, unspecified: Secondary | ICD-10-CM | POA: Insufficient documentation

## 2023-08-05 MED ORDER — METOCLOPRAMIDE HCL 5 MG/ML IJ SOLN
10.0000 mg | Freq: Once | INTRAMUSCULAR | Status: AC
  Administered 2023-08-05: 10 mg via INTRAVENOUS
  Filled 2023-08-05: qty 2

## 2023-08-05 MED ORDER — KETOROLAC TROMETHAMINE 15 MG/ML IJ SOLN
15.0000 mg | Freq: Once | INTRAMUSCULAR | Status: AC
  Administered 2023-08-05: 15 mg via INTRAVENOUS
  Filled 2023-08-05: qty 1

## 2023-08-05 NOTE — Discharge Instructions (Addendum)
Thank you for allowing [place] to be a part of your care today.  You were given medications to help treat your headache.  I recommend following up with your primary care doctor if these headaches persist.   You can take 800 mg ibuprofen every 6-8 hours as needed for headache.  Do not take this for more than 3 days in a week to avoid a medication rebound headache.   Return to the ED if you develop sudden worsening of your symptoms or if you have new concerns.

## 2023-08-05 NOTE — ED Triage Notes (Signed)
Pt c/o HA for 2x weeks. No vision changes. Aox4

## 2023-08-05 NOTE — ED Provider Notes (Signed)
[place] EMERGENCY DEPARTMENT AT [facility] Provider Note   CSN: [id number] Arrival date & time: 08/05/23  9629     History  Chief Complaint  Patient presents with   Headache    Kimberly Fleming is a 37 y.o. female without significant past medical history presents to the ED complaining of a headache for the past 2 weeks.  She reports the headache is intermittent.  She does not have a history of headaches, but reports her mother did.  She states the headache has been on the left side, across her forehead, occasionally on the top of her head or the back of her head.  Endorses associated photophobia.  She has been trying OTC medications without relief.  Denies nausea, vomiting, visual disturbance, weakness or numbness in the extremities, fever, neck pain or stiffness.  Patient also reports she has not ben wearing her prescription eyeglasses.       Home Medications Prior to Admission medications   Medication Sig Start Date End Date Taking? Authorizing Provider  acetaminophen (TYLENOL) 500 MG tablet Take 500 mg by mouth every 6 (six) hours as needed for moderate pain.    [provider]  cetirizine (ZYRTEC ALLERGY) 10 MG tablet Take 1 tablet (10 mg total) by mouth daily. Patient not taking: Reported on 04/07/2023 02/17/23   Rising, Lurena Joiner, PA-C  ibuprofen (ADVIL) 800 MG tablet Take 1 tablet (800 mg total) by mouth 3 (three) times daily. Patient not taking: Reported on 04/07/2023 02/17/23   Rising, Lurena Joiner, PA-C  olopatadine (PATANOL) 0.1 % ophthalmic solution Place 1 drop into both eyes 2 (two) times daily. Patient not taking: Reported on 04/07/2023 02/17/23   Rising, Lurena Joiner, PA-C      Allergies    Patient has no known allergies.    Review of Systems   Review of Systems  Constitutional:  Negative for fever.  Eyes:  Positive for photophobia. Negative for visual disturbance.  Musculoskeletal:  Negative for neck pain and neck stiffness.  Neurological:  Positive for  headaches. Negative for weakness and numbness.    Physical Exam Updated Vital Signs BP (!) 133/99 (BP Location: Left Arm)   Pulse 80   Temp 98.4 F (36.9 C) (Oral)   Resp 18   Ht 5\' 8"  (1.727 m)   Wt 90.7 kg   SpO2 100%   BMI 30.41 kg/m  Physical Exam Vitals and nursing note reviewed.  Constitutional:      General: She is not in acute distress.    Appearance: Normal appearance. She is not ill-appearing or diaphoretic.  HENT:     Head: Normocephalic and atraumatic.     Nose:     Right Sinus: No maxillary sinus tenderness or frontal sinus tenderness.     Left Sinus: No maxillary sinus tenderness or frontal sinus tenderness.  Eyes:     General: Lids are normal. Vision grossly intact.     Extraocular Movements: Extraocular movements intact.     Pupils: Pupils are equal, round, and reactive to light.  Cardiovascular:     Rate and Rhythm: Normal rate and regular rhythm.  Pulmonary:     Effort: Pulmonary effort is normal.  Musculoskeletal:     Cervical back: Full passive range of motion without pain.  Skin:    General: Skin is warm and dry.     Capillary Refill: Capillary refill takes less than 2 seconds.  Neurological:     Mental Status: She is alert. Mental status is at baseline.  GCS: GCS eye subscore is 4. GCS verbal subscore is 5. GCS motor subscore is 6.     Cranial Nerves: No dysarthria or facial asymmetry.     Sensory: No sensory deficit.     Motor: No weakness or pronator drift.     Coordination: Coordination is intact.     Gait: Gait is intact.  Psychiatric:        Mood and Affect: Mood normal.        Behavior: Behavior normal.     ED Results / Procedures / Treatments   Labs (all labs ordered are listed, but only abnormal results are displayed) Labs Reviewed - No data to display  EKG None  Radiology No results found.  Procedures Procedures    Medications Ordered in ED Medications  metoCLOPramide (REGLAN) injection 10 mg (10 mg Intravenous  Given 08/05/23 0919)  ketorolac (TORADOL) 15 MG/ML injection 15 mg (15 mg Intravenous Given 08/05/23 0915)    ED Course/ Medical Decision Making/ A&P                                 Medical Decision Making Risk Prescription drug management.   This patient presents to the ED with chief complaint(s) of intermittent headache x2 weeks with non-contributory past medical history.  The complaint involves an extensive differential diagnosis and also carries with it a high risk of complications and morbidity.    The differential diagnosis includes tension headache, migraine headache, cluster headache   Initial Assessment:   Exam significant for overall well-appearing patient who is sitting up but does not appear to be in distress.  EOM intact, PERRL.  Neuroexam is overall reassuring.  Speech is clear.  No tenderness to frontal or maxillary sinuses.  Full ROM of the neck without pain.  Vital signs are stable.  Treatment and Reassessment: Patient given IV ketorolac and metoclopramide for treatment of headache.  Suspicious for migraine headache as patient does have associated photophobia and headache has been mainly left sided.    Upon reassessment, patient had improvement in symptoms.  Disposition:   Advised patient to follow up with PCP if she continues to have intermittent headaches.  Recommended ibuprofen as needed for headache relief.  The patient has been appropriately medically screened and/or stabilized in the ED. I have low suspicion for any other emergent medical condition which would require further screening, evaluation or treatment in the ED or require inpatient management. At time of discharge the patient is hemodynamically stable and in no acute distress. I have discussed work-up results and diagnosis with patient and answered all questions. Patient is agreeable with discharge plan. We discussed strict return precautions for returning to the emergency department and they verbalized  understanding.             Final Clinical Impression(s) / ED Diagnoses Final diagnoses:  Bad headache    Rx / DC Orders ED Discharge Orders     None         Lenard Simmer, PA-C 08/05/23 0944    Anders Simmonds T, DO 08/06/23 1124

## 2023-08-09 ENCOUNTER — Other Ambulatory Visit (HOSPITAL_BASED_OUTPATIENT_CLINIC_OR_DEPARTMENT_OTHER): Payer: Self-pay

## 2023-08-09 ENCOUNTER — Other Ambulatory Visit: Payer: Self-pay

## 2023-08-09 ENCOUNTER — Emergency Department (HOSPITAL_BASED_OUTPATIENT_CLINIC_OR_DEPARTMENT_OTHER)
Admission: EM | Admit: 2023-08-09 | Discharge: 2023-08-09 | Discharge status: Against Medical Advice | Payer: Medicaid Other | Attending: Emergency Medicine | Admitting: Emergency Medicine

## 2023-08-09 ENCOUNTER — Encounter (HOSPITAL_BASED_OUTPATIENT_CLINIC_OR_DEPARTMENT_OTHER): Payer: Self-pay

## 2023-08-09 DIAGNOSIS — R519 Headache, unspecified: Secondary | ICD-10-CM | POA: Insufficient documentation

## 2023-08-09 DIAGNOSIS — Z5321 Procedure and treatment not carried out due to patient leaving prior to being seen by health care provider: Secondary | ICD-10-CM | POA: Insufficient documentation

## 2023-08-09 NOTE — ED Triage Notes (Signed)
In for eval of headache for a couple of weeks. Unable to see PCP till January.

## 2023-08-09 NOTE — ED Notes (Signed)
Registration reports patient left the department

## 2023-08-10 ENCOUNTER — Emergency Department (HOSPITAL_COMMUNITY)
Admission: EM | Admit: 2023-08-10 | Discharge: 2023-08-10 | Disposition: A | Discharge status: Home / Self Care | Payer: Medicaid Other | Attending: Emergency Medicine | Admitting: Emergency Medicine

## 2023-08-10 ENCOUNTER — Encounter (HOSPITAL_COMMUNITY): Payer: Self-pay

## 2023-08-10 ENCOUNTER — Emergency Department (HOSPITAL_COMMUNITY): Payer: Medicaid Other

## 2023-08-10 DIAGNOSIS — G43011 Migraine without aura, intractable, with status migrainosus: Secondary | ICD-10-CM

## 2023-08-10 DIAGNOSIS — R519 Headache, unspecified: Secondary | ICD-10-CM | POA: Diagnosis present

## 2023-08-10 DIAGNOSIS — J302 Other seasonal allergic rhinitis: Secondary | ICD-10-CM

## 2023-08-10 LAB — CBC WITH DIFFERENTIAL/PLATELET
Abs Immature Granulocytes: 0.01 10*3/uL (ref 0.00–0.07)
Basophils Absolute: 0 10*3/uL (ref 0.0–0.1)
Basophils Relative: 1 %
Eosinophils Absolute: 0.3 10*3/uL (ref 0.0–0.5)
Eosinophils Relative: 4 %
HCT: 41 % (ref 36.0–46.0)
Hemoglobin: 13.1 g/dL (ref 12.0–15.0)
Immature Granulocytes: 0 %
Lymphocytes Relative: 31 %
Lymphs Abs: 1.9 10*3/uL (ref 0.7–4.0)
MCH: 28.2 pg (ref 26.0–34.0)
MCHC: 32 g/dL (ref 30.0–36.0)
MCV: 88.4 fL (ref 80.0–100.0)
Monocytes Absolute: 0.4 10*3/uL (ref 0.1–1.0)
Monocytes Relative: 6 %
Neutro Abs: 3.5 10*3/uL (ref 1.7–7.7)
Neutrophils Relative %: 58 %
Platelets: 166 10*3/uL (ref 150–400)
RBC: 4.64 MIL/uL (ref 3.87–5.11)
RDW: 12.4 % (ref 11.5–15.5)
WBC: 6.1 10*3/uL (ref 4.0–10.5)
nRBC: 0 % (ref 0.0–0.2)

## 2023-08-10 LAB — BASIC METABOLIC PANEL
Anion gap: 7 (ref 5–15)
BUN: 15 mg/dL (ref 6–20)
CO2: 24 mmol/L (ref 22–32)
Calcium: 8.6 mg/dL — ABNORMAL LOW (ref 8.9–10.3)
Chloride: 104 mmol/L (ref 98–111)
Creatinine, Ser: 0.72 mg/dL (ref 0.44–1.00)
GFR, Estimated: >60 mL/min (ref >60)
Glucose, Bld: 131 mg/dL — ABNORMAL HIGH (ref 70–99)
Potassium: 3.5 mmol/L (ref 3.5–5.1)
Sodium: 135 mmol/L (ref 135–145)

## 2023-08-10 MED ORDER — BUTALBITAL-APAP-CAFFEINE 50-325-40 MG PO TABS: 1.0000 | ORAL_TABLET | Freq: Four times a day (QID) | ORAL | 0 refills | Status: AC | PRN

## 2023-08-10 MED ORDER — FEXOFENADINE HCL 60 MG PO TABS: 60.0000 mg | ORAL_TABLET | Freq: Two times a day (BID) | ORAL | 1 refills | Status: AC

## 2023-08-10 MED ORDER — FLUTICASONE PROPIONATE 50 MCG/ACT NA SUSP: 1.0000 | Freq: Every day | NASAL | 1 refills | Status: AC

## 2023-08-10 MED ORDER — ACETAMINOPHEN 500 MG PO TABS
1000.0000 mg | ORAL_TABLET | Freq: Once | ORAL | Status: AC
  Administered 2023-08-10: 1000 mg via ORAL
  Filled 2023-08-10: qty 2

## 2023-08-10 MED ORDER — IBUPROFEN 200 MG PO TABS
400.0000 mg | ORAL_TABLET | Freq: Once | ORAL | Status: AC
  Administered 2023-08-10: 400 mg via ORAL
  Filled 2023-08-10: qty 2

## 2023-08-10 NOTE — Discharge Instructions (Signed)
Scan and blood work today looked normal.  You can try the allergy medicine and nasal spray to see if that helps with the allergies and the headache will go away.  It is okay to use the prescription Fioricet as needed for the headache but make sure you are at home and not driving when you take it.  You can still do ibuprofen as needed.  If you start having constant vision issues, vomiting, fever, neck pain or numbness or weakness on one side of your body return to the emergency room.

## 2023-08-10 NOTE — ED Provider Notes (Signed)
[place] EMERGENCY DEPARTMENT AT [facility] Provider Note   CSN: [id number] Arrival date & time: 08/10/23  0846     History  Chief Complaint  Patient presents with   Headache    Kimberly Fleming is a 37 y.o. female.  Patient is a 37 year old female with no known medical problems who is presenting today with an ongoing headache.  Patient reports that the headache has now been present for approximately 2 or 3 weeks.  Initially it was not coming and going but now it seems more persistent but at times will be worse.  She does complain of photophobia and feels that in bright lights her vision is blurry but denies any blurry vision otherwise.  The pain is behind her left eye and on the top of her head.  She denies any neck pain.  No numbness or weakness on one side of her body.  No fevers.  She does report having URI type symptoms at the end of October but those resolved after 2 or 3 days.  She does still feel some nasal congestion and is intermittently taking Zyrtec because she thinks she may have allergies.  She denies any vomiting, history of migraines.  She has been taking Tylenol at home occasionally Motrin or Excedrin Migraine.  The history is provided by the patient and medical records.  Headache      Home Medications Prior to Admission medications   Medication Sig Start Date End Date Taking? Authorizing Provider  butalbital-acetaminophen-caffeine (FIORICET) 50-325-40 MG tablet Take 1 tablet by mouth every 6 (six) hours as needed for headache. 08/10/23 08/09/24 Yes Mackenzy Grumbine, Alphonzo Lemmings, MD  fexofenadine (ALLEGRA) 60 MG tablet Take 1 tablet (60 mg total) by mouth 2 (two) times daily. 08/10/23  Yes Kalei Mckillop, Alphonzo Lemmings, MD  fluticasone (FLONASE) 50 MCG/ACT nasal spray Place 1 spray into both nostrils daily. 08/10/23  Yes Gwyneth Sprout, MD  acetaminophen (TYLENOL) 500 MG tablet Take 500 mg by mouth every 6 (six) hours as needed for moderate pain.    [provider]   ibuprofen (ADVIL) 800 MG tablet Take 1 tablet (800 mg total) by mouth 3 (three) times daily. Patient not taking: Reported on 04/07/2023 02/17/23   Rising, Lurena Joiner, PA-C  olopatadine (PATANOL) 0.1 % ophthalmic solution Place 1 drop into both eyes 2 (two) times daily. Patient not taking: Reported on 04/07/2023 02/17/23   Rising, Lurena Joiner, PA-C      Allergies    Patient has no known allergies.    Review of Systems   Review of Systems  Neurological:  Positive for headaches.    Physical Exam Updated Vital Signs BP (!) 133/93 (BP Location: Left Arm)   Pulse 88   Temp 98.7 F (37.1 C) (Oral)   Resp 20   Ht 5\' 8"  (1.727 m)   Wt 90.7 kg   LMP 07/22/2023 (Approximate)   SpO2 100%   BMI 30.41 kg/m  Physical Exam Vitals and nursing note reviewed.  Constitutional:      General: She is not in acute distress.    Appearance: She is well-developed.  HENT:     Head: Normocephalic and atraumatic.     Right Ear: Tympanic membrane normal.     Left Ear: Tympanic membrane normal.     Nose:     Right Turbinates: Enlarged.     Left Turbinates: Enlarged.  Eyes:     Extraocular Movements: Extraocular movements intact.     Right eye: No nystagmus.     Left eye:  No nystagmus.     Pupils: Pupils are equal, round, and reactive to light.  Cardiovascular:     Rate and Rhythm: Normal rate and regular rhythm.     Heart sounds: Normal heart sounds. No murmur heard.    No friction rub.  Pulmonary:     Effort: Pulmonary effort is normal.     Breath sounds: Normal breath sounds. No wheezing or rales.  Abdominal:     Palpations: Abdomen is soft.  Musculoskeletal:        General: No tenderness. Normal range of motion.     Cervical back: Normal range of motion and neck supple. No rigidity. No spinous process tenderness or muscular tenderness.     Comments: No edema  Skin:    General: Skin is warm and dry.     Findings: No rash.  Neurological:     Mental Status: She is alert and oriented to person,  place, and time. Mental status is at baseline.     Cranial Nerves: No cranial nerve deficit.     Sensory: No sensory deficit.     Motor: No weakness.  Psychiatric:        Mood and Affect: Mood normal.        Behavior: Behavior normal.     ED Results / Procedures / Treatments   Labs (all labs ordered are listed, but only abnormal results are displayed) Labs Reviewed  BASIC METABOLIC PANEL - Abnormal; Notable for the following components:      Result Value   Glucose, Bld 131 (*)    Calcium 8.6 (*)    All other components within normal limits  CBC WITH DIFFERENTIAL/PLATELET    EKG None  Radiology CT Head Wo Contrast  Result Date: 08/10/2023 CLINICAL DATA:  Headache. Worsening frequency or severity. Blurred vision and light sensitivity. Symptoms over the last 2 weeks. EXAM: CT HEAD WITHOUT CONTRAST TECHNIQUE: Contiguous axial images were obtained from the base of the skull through the vertex without intravenous contrast. RADIATION DOSE REDUCTION: This exam was performed according to the departmental dose-optimization program which includes automated exposure control, adjustment of the mA and/or kV according to patient size and/or use of iterative reconstruction technique. COMPARISON:  None Available. FINDINGS: Brain: The brain shows a normal appearance without evidence of malformation, atrophy, old or acute small or large vessel infarction, mass lesion, hemorrhage, hydrocephalus or extra-axial collection. Vascular: No hyperdense vessel. No evidence of atherosclerotic calcification. Skull: Normal.  No traumatic finding.  No focal bone lesion. Sinuses/Orbits: Sinuses are clear. Orbits appear normal. Mastoids are clear. Other: None significant IMPRESSION: Normal head CT. Electronically Signed   By: Paulina Fusi M.D.   On: 08/10/2023 09:58    Procedures Procedures    Medications Ordered in ED Medications  acetaminophen (TYLENOL) tablet 1,000 mg (1,000 mg Oral Given 08/10/23 1002)   ibuprofen (ADVIL) tablet 400 mg (400 mg Oral Given 08/10/23 1002)    ED Course/ Medical Decision Making/ A&P                                 Medical Decision Making Amount and/or Complexity of Data Reviewed Labs: ordered. Decision-making details documented in ED Course. Radiology: ordered and independent interpretation performed. Decision-making details documented in ED Course.  Risk OTC drugs. Prescription drug management.   Pt with multiple medical problems and comorbidities and presenting today with a complaint that caries a high risk for morbidity and mortality.  Returning today for persistent headache.  Now has been present for several weeks.  Patient has no neurologic findings on exam to suggest stroke, bleed.  Low suspicion for dural venous thrombosis and low risk as patient is not on OCPs, not use tobacco products and no prior history of clots.  Suspect migraine versus seasonal allergies.  No symptoms to suggest meningitis or infectious etiology.  This is the third visit in 2 weeks will image to ensure no evidence of space-occupying lesion.  Patient symptoms are not classic for IIH.  She confirms that she only feels like her vision is a little bit blurry and bright light but otherwise her vision has been normal. Currently patient just wants some Tylenol and ibuprofen for pain as she reports feeling weird after getting Reglan and Toradol last week.  12:01 PM I independently interpreted patient's labs I independently interpreted patient's labs and CBC, BMP within normal limits at this time.  I have independently visualized and interpreted pt's images today.  Head CT without any acute bleed.  Radiology reports negative head CT.  Will try treatment for allergies with Allegra and Flonase.  Patient given Fioricet to try for headaches when they become severe.  Encouraged follow-up with PCP as she had planned.  Patient has been mildly hypertensive here but do not feel that that is the source  of her headaches.  Will need to be reevaluated by PCP.         Final Clinical Impression(s) / ED Diagnoses Final diagnoses:  Intractable migraine without aura and with status migrainosus  Seasonal allergies    Rx / DC Orders ED Discharge Orders          Ordered    fluticasone (FLONASE) 50 MCG/ACT nasal spray  Daily        08/10/23 1159    fexofenadine (ALLEGRA) 60 MG tablet  2 times daily        08/10/23 1159    butalbital-acetaminophen-caffeine (FIORICET) 50-325-40 MG tablet  Every 6 hours PRN        08/10/23 1159              Gwyneth Sprout, MD 08/10/23 1203

## 2023-08-10 NOTE — ED Triage Notes (Signed)
Pt c/o headache x2 weeks.  +Blurred vision and light sensitivity.  Pt has not taken anything for pain.  Pain score 9/10.  Pt was a LWBS at [facility] yesterday.

## 2023-08-11 ENCOUNTER — Emergency Department (HOSPITAL_COMMUNITY)
Admission: EM | Admit: 2023-08-11 | Discharge: 2023-08-11 | Disposition: A | Discharge status: Home / Self Care | Payer: Medicaid Other | Attending: Emergency Medicine | Admitting: Emergency Medicine

## 2023-08-11 ENCOUNTER — Encounter (HOSPITAL_COMMUNITY): Payer: Self-pay | Admitting: Emergency Medicine

## 2023-08-11 ENCOUNTER — Other Ambulatory Visit: Payer: Self-pay

## 2023-08-11 DIAGNOSIS — R519 Headache, unspecified: Secondary | ICD-10-CM | POA: Diagnosis present

## 2023-08-11 NOTE — ED Triage Notes (Signed)
Patient arrives ambulatory by POV c/o headaches and scalp pain. States she was seen yesterday but still having pain.

## 2023-08-11 NOTE — ED Provider Notes (Signed)
[place] EMERGENCY DEPARTMENT AT [facility] Provider Note   CSN: [id number] Arrival date & time: 08/11/23  4098     History Chief Complaint  Patient presents with   Headache    HPI Kimberly Fleming is a 37 y.o. female presenting for ongoing scalp pain. See H&P from Dr. Tanna Savoy yesterday for complete history.  No acute change from that history.  Symptoms resolved after Tylenol and Advil but then recurred this morning.  Patient states that she was just seeking second opinion.  Patient's recorded medical, surgical, social, medication list and allergies were reviewed in the Snapshot window as part of the initial history.   Review of Systems   Review of Systems  Constitutional:  Negative for chills and fever.  HENT:  Negative for ear pain and sore throat.   Eyes:  Negative for pain and visual disturbance.  Respiratory:  Negative for cough and shortness of breath.   Cardiovascular:  Negative for chest pain and palpitations.  Gastrointestinal:  Negative for abdominal pain and vomiting.  Genitourinary:  Negative for dysuria and hematuria.  Musculoskeletal:  Negative for arthralgias and back pain.  Skin:  Negative for color change and rash.  Neurological:  Negative for seizures and syncope.  All other systems reviewed and are negative.   Physical Exam Updated Vital Signs BP (!) 136/96 (BP Location: Left Arm)   Pulse 89   Temp 98.2 F (36.8 C) (Oral)   Resp 16   Ht 5\' 8"  (1.727 m)   Wt 90.7 kg   LMP 07/22/2023 (Approximate)   SpO2 100%   BMI 30.41 kg/m  Physical Exam Constitutional:      General: She is not in acute distress.    Appearance: She is not ill-appearing or toxic-appearing.  HENT:     Head: Normocephalic and atraumatic.  Eyes:     Extraocular Movements: Extraocular movements intact.     Pupils: Pupils are equal, round, and reactive to light.  Cardiovascular:     Rate and Rhythm: Normal rate.  Pulmonary:     Effort: No respiratory distress.   Abdominal:     General: Abdomen is flat.  Musculoskeletal:        General: No swelling, deformity or signs of injury.     Cervical back: Normal range of motion. No rigidity.  Skin:    General: Skin is warm and dry.  Neurological:     General: No focal deficit present.     Mental Status: She is alert and oriented to person, place, and time.  Psychiatric:        Mood and Affect: Mood normal.      ED Course/ Medical Decision Making/ A&P    Procedures Procedures   Medications Ordered in ED Medications - No data to display  Medical Decision Making:   Patient with tension type headache.  She appears to have some alopecia developing.  States that her hair was in very tight braids for years and was recently released due to the developing alopecia. Likely from that tension from her braids.  Scalp pain is atypical and does not fit with any acute pathology.  No visual symptoms today, denies fevers chills nausea vomiting syncope or shortness of breath. Patient ambulatory tolerating p.o. intake. Discussed continuing supportive care plan laid out yesterday.  She already underwent cross-sectional imaging and is not had any acute worsening of the symptoms.  Refer back to PCP in the outpatient setting for further care and management  Disposition:  I  have considered need for hospitalization, however, considering all of the above, I believe this patient is stable for discharge at this time.  Patient/family educated about specific return precautions for given chief complaint and symptoms.  Patient/family educated about follow-up with PCP.     Patient/family expressed understanding of return precautions and need for follow-up. Patient spoken to regarding all imaging and laboratory results and appropriate follow up for these results. All education provided in verbal form with additional information in written form. Time was allowed for answering of patient questions. Patient discharged.     Emergency Department Medication Summary:   Medications - No data to display     Clinical Impression:  1. Acute nonintractable headache, unspecified headache type      Discharge   Final Clinical Impression(s) / ED Diagnoses Final diagnoses:  Acute nonintractable headache, unspecified headache type    Rx / DC Orders ED Discharge Orders     None         Glyn Ade, MD 08/11/23 1045

## 2023-08-13 ENCOUNTER — Emergency Department (HOSPITAL_COMMUNITY)
Admission: EM | Admit: 2023-08-13 | Discharge: 2023-08-13 | Disposition: A | Discharge status: Home / Self Care | Payer: Medicaid Other | Attending: Emergency Medicine | Admitting: Emergency Medicine

## 2023-08-13 ENCOUNTER — Encounter (HOSPITAL_COMMUNITY): Payer: Self-pay

## 2023-08-13 DIAGNOSIS — R519 Headache, unspecified: Secondary | ICD-10-CM | POA: Diagnosis present

## 2023-08-13 MED ORDER — DEXAMETHASONE SODIUM PHOSPHATE 10 MG/ML IJ SOLN
10.0000 mg | Freq: Once | INTRAMUSCULAR | Status: AC
Start: 2023-08-13 — End: 2023-08-13
  Administered 2023-08-13: 10 mg via INTRAMUSCULAR
  Filled 2023-08-13: qty 1

## 2023-08-13 NOTE — ED Triage Notes (Signed)
Pt c/o headache x several days.  Pain score 8/10.  Pt has not taken prescribed Fioricet because she was told to only take it at night.

## 2023-08-13 NOTE — Discharge Instructions (Signed)
Continue your other medications that have been prescribed. You might try cool compresses to the scalp where it is tender. "Mildly cold compresses" meaning put several layers between the scalp and your ice pack. See if that helps.   Keep you scheduled appointment with your doctor for further outpatient follow up.

## 2023-08-13 NOTE — ED Provider Notes (Signed)
[place] EMERGENCY DEPARTMENT AT [facility] Provider Note   CSN: [id number] Arrival date & time: 08/13/23  1831     History  Chief Complaint  Patient presents with   Headache    Kimberly Fleming is a 37 y.o. female.  Patient to ED for continued evaluation of headache pain. She reports pain in the crown of the head, tender to touch, with small amount of hair loss. No fever. She has presented multiple times to this ED for same. No change with medications that have been prescribed. No nausea, vomiting.   The history is provided by the patient. No language interpreter was used.       Home Medications Prior to Admission medications   Medication Sig Start Date End Date Taking? Authorizing Provider  acetaminophen (TYLENOL) 500 MG tablet Take 500 mg by mouth every 6 (six) hours as needed for moderate pain.    [provider]  butalbital-acetaminophen-caffeine (FIORICET) 50-325-40 MG tablet Take 1 tablet by mouth every 6 (six) hours as needed for headache. 08/10/23 08/09/24  Gwyneth Sprout, MD  fexofenadine (ALLEGRA) 60 MG tablet Take 1 tablet (60 mg total) by mouth 2 (two) times daily. 08/10/23   Gwyneth Sprout, MD  fluticasone (FLONASE) 50 MCG/ACT nasal spray Place 1 spray into both nostrils daily. 08/10/23   Gwyneth Sprout, MD  ibuprofen (ADVIL) 800 MG tablet Take 1 tablet (800 mg total) by mouth 3 (three) times daily. Patient not taking: Reported on 04/07/2023 02/17/23   Rising, Lurena Joiner, PA-C  olopatadine (PATANOL) 0.1 % ophthalmic solution Place 1 drop into both eyes 2 (two) times daily. Patient not taking: Reported on 04/07/2023 02/17/23   Rising, Lurena Joiner, PA-C      Allergies    Patient has no known allergies.    Review of Systems   Review of Systems  Physical Exam Updated Vital Signs BP (!) 126/95   Pulse 87   Temp 97.7 F (36.5 C) (Oral)   Resp 16   Ht 5\' 8"  (1.727 m)   Wt 90.7 kg   LMP 07/22/2023 (Approximate)   SpO2 100%   BMI 30.41  kg/m  Physical Exam Vitals and nursing note reviewed.  Constitutional:      Appearance: Normal appearance.  HENT:     Head:     Comments: Small area of hair loss at top of scalp. Tender. No redness, fluctuance. No other scalp tenderness.  Skin:    General: Skin is warm and dry.     Findings: No erythema.  Neurological:     Mental Status: She is alert and oriented to person, place, and time.     ED Results / Procedures / Treatments   Labs (all labs ordered are listed, but only abnormal results are displayed) Labs Reviewed - No data to display  EKG None  Radiology No results found.  Procedures Procedures    Medications Ordered in ED Medications  dexamethasone (DECADRON) injection 10 mg (10 mg Intramuscular Given 08/13/23 1904)    ED Course/ Medical Decision Making/ A&P Clinical Course as of 08/13/23 1911  Sat Aug 13, 2023  1909 Patient to ED for 4th ED visit since 11/8 for headache. HA felt to be scalp tenderness, possibly secondary to wearing tight braids over a long period of time that were recently taken out. No evidence infection. Head CT 11/13 negative. CBC, CMET 11/14 negative. She is on sinus medications without change in symptoms. Suspect local scalp irritation causing tenderness. IM Decadron 10 mg provided. She has  a scheduled appointment with primary care and is encouraged to keep this appointment.  [SU]    Clinical Course User Index [SU] Elpidio Anis, PA-C                                 Medical Decision Making Risk Prescription drug management.           Final Clinical Impression(s) / ED Diagnoses Final diagnoses:  Scalp pain    Rx / DC Orders ED Discharge Orders     None         Elpidio Anis, PA-C 08/13/23 1911    Loetta Rough, MD 08/13/23 [phone]

## 2023-08-19 ENCOUNTER — Other Ambulatory Visit: Payer: Self-pay

## 2023-08-19 ENCOUNTER — Emergency Department (HOSPITAL_COMMUNITY)
Admission: EM | Admit: 2023-08-19 | Discharge: 2023-08-19 | Disposition: A | Discharge status: Home / Self Care | Payer: Medicaid Other | Attending: Emergency Medicine | Admitting: Emergency Medicine

## 2023-08-19 DIAGNOSIS — R519 Headache, unspecified: Secondary | ICD-10-CM | POA: Insufficient documentation

## 2023-08-19 MED ORDER — KETOROLAC TROMETHAMINE 15 MG/ML IJ SOLN
15.0000 mg | Freq: Once | INTRAMUSCULAR | Status: AC
  Administered 2023-08-19: 15 mg via INTRAMUSCULAR
  Filled 2023-08-19: qty 1

## 2023-08-19 NOTE — ED Triage Notes (Signed)
Pt complaining of a headache/temple pain that has been present for several weeks. Pt has been seen recently for same complaint several times. Pt has tried taking the Fioricet with little relief, pt states the headaches are slightly better since last visit, but not much change.

## 2023-08-19 NOTE — ED Provider Notes (Signed)
[place] EMERGENCY DEPARTMENT AT [facility] Provider Note   CSN: [id number] Arrival date & time: 08/19/23  3086     History  Chief Complaint  Patient presents with   Headache    Kimberly Fleming is a 37 y.o. female.  37 year old female presents today for concern of headache.  She has been seen multiple times in the ED for the same complaint.  She states has not gotten any better.  She states her PCP follow-up is not until the end of January.  She states it is better than it was before but still ongoing.  No nausea, visual change, vomiting, or balance issues.  Has been taking home medications with some relief.  The history is provided by the patient. No language interpreter was used.       Home Medications Prior to Admission medications   Medication Sig Start Date End Date Taking? Authorizing Provider  acetaminophen (TYLENOL) 500 MG tablet Take 500 mg by mouth every 6 (six) hours as needed for moderate pain.    [provider]  butalbital-acetaminophen-caffeine (FIORICET) 50-325-40 MG tablet Take 1 tablet by mouth every 6 (six) hours as needed for headache. 08/10/23 08/09/24  Gwyneth Sprout, MD  fexofenadine (ALLEGRA) 60 MG tablet Take 1 tablet (60 mg total) by mouth 2 (two) times daily. 08/10/23   Gwyneth Sprout, MD  fluticasone (FLONASE) 50 MCG/ACT nasal spray Place 1 spray into both nostrils daily. 08/10/23   Gwyneth Sprout, MD  ibuprofen (ADVIL) 800 MG tablet Take 1 tablet (800 mg total) by mouth 3 (three) times daily. Patient not taking: Reported on 04/07/2023 02/17/23   Rising, Lurena Joiner, PA-C  olopatadine (PATANOL) 0.1 % ophthalmic solution Place 1 drop into both eyes 2 (two) times daily. Patient not taking: Reported on 04/07/2023 02/17/23   Rising, Lurena Joiner, PA-C      Allergies    Patient has no known allergies.    Review of Systems   Review of Systems  Constitutional:  Negative for chills and fever.  Eyes:  Negative for visual disturbance.   Neurological:  Positive for headaches. Negative for dizziness, weakness and light-headedness.  All other systems reviewed and are negative.   Physical Exam Updated Vital Signs BP (!) 137/98 (BP Location: Left Arm)   Pulse 75   Temp 98.4 F (36.9 C) (Oral)   Resp 16   Ht 5\' 10"  (1.778 m)   Wt 91.9 kg   LMP 07/22/2023 (Approximate)   SpO2 99%   BMI 29.07 kg/m  Physical Exam Vitals and nursing note reviewed.  Constitutional:      General: She is not in acute distress.    Appearance: Normal appearance. She is not ill-appearing.  HENT:     Head: Normocephalic and atraumatic.     Nose: Nose normal.  Eyes:     Conjunctiva/sclera: Conjunctivae normal.     Pupils: Pupils are equal, round, and reactive to light.  Cardiovascular:     Rate and Rhythm: Normal rate.  Pulmonary:     Effort: Pulmonary effort is normal. No respiratory distress.  Musculoskeletal:        General: No deformity. Normal range of motion.     Cervical back: Normal range of motion.  Skin:    Findings: No rash.  Neurological:     General: No focal deficit present.     Mental Status: She is alert and oriented to person, place, and time. Mental status is at baseline.     Cranial Nerves: No cranial nerve  deficit.     Sensory: No sensory deficit.     Motor: No weakness.     ED Results / Procedures / Treatments   Labs (all labs ordered are listed, but only abnormal results are displayed) Labs Reviewed - No data to display  EKG None  Radiology No results found.  Procedures Procedures    Medications Ordered in ED Medications  ketorolac (TORADOL) 15 MG/ML injection 15 mg (has no administration in time range)    ED Course/ Medical Decision Making/ A&P                                 Medical Decision Making Risk Prescription drug management.   37 year old female presents today for concern of headache.  Seems to be a chronic issue that she has been to the emergency department for multiple  times.  Currently pending to be established with a PCP.  Will give referral to PCP so she can explore being established sooner.  Symptomatic management discussed.  Discussed follow-up with PCP.  Discussed she had a recent CT scan of the head which was reassuring.  Neurological exam without focal deficits.  Final Clinical Impression(s) / ED Diagnoses Final diagnoses:  Nonintractable headache, unspecified chronicity pattern, unspecified headache type    Rx / DC Orders ED Discharge Orders     None         Marita Kansas, PA-C 08/19/23 1314    Linwood Dibbles, MD 08/19/23 1558

## 2023-08-19 NOTE — Discharge Instructions (Addendum)
Your exam today was reassuring.  I have listed a primary care clinic for you to establish care with.  Take Tylenol, Motrin, or Fioricet as discussed.  Continue using the Flonase nasal spray.  You can also use sinus rinses.  Return for any concerning symptoms.

## 2023-09-07 ENCOUNTER — Ambulatory Visit: Payer: Medicaid Other | Admitting: Student

## 2024-01-18 ENCOUNTER — Ambulatory Visit: Payer: Self-pay | Admitting: Internal Medicine

## 2024-01-31 ENCOUNTER — Emergency Department (HOSPITAL_COMMUNITY)
Admission: EM | Admit: 2024-01-31 | Discharge: 2024-01-31 | Disposition: A | Discharge status: Home / Self Care | Attending: Emergency Medicine | Admitting: Emergency Medicine

## 2024-01-31 ENCOUNTER — Encounter (HOSPITAL_COMMUNITY): Payer: Self-pay | Admitting: Emergency Medicine

## 2024-01-31 ENCOUNTER — Other Ambulatory Visit: Payer: Self-pay

## 2024-01-31 DIAGNOSIS — K0889 Other specified disorders of teeth and supporting structures: Secondary | ICD-10-CM | POA: Diagnosis present

## 2024-01-31 DIAGNOSIS — K0530 Chronic periodontitis, unspecified: Secondary | ICD-10-CM

## 2024-01-31 DIAGNOSIS — K0520 Aggressive periodontitis, unspecified: Secondary | ICD-10-CM | POA: Insufficient documentation

## 2024-01-31 MED ORDER — CHLORHEXIDINE GLUCONATE 0.12 % MT SOLN: 15.0000 mL | Freq: Two times a day (BID) | OROMUCOSAL | 0 refills | Status: AC

## 2024-01-31 MED ORDER — CLINDAMYCIN HCL 300 MG PO CAPS: 300.0000 mg | ORAL_CAPSULE | Freq: Four times a day (QID) | ORAL | 0 refills | Status: AC

## 2024-01-31 NOTE — ED Triage Notes (Signed)
 Pt reports having teeth pulled last week. States she was concerned for abscess in her gum. Went back to dentist and was told it was not an abscess. Pt reports she is here today to make sure it looks okay. Has follow-up dentist appt tomorrow.

## 2024-01-31 NOTE — ED Provider Notes (Signed)
 [place] EMERGENCY DEPARTMENT AT [facility] Provider Note   CSN: [id number] Arrival date & time: 01/31/24  4098     History  Chief Complaint  Patient presents with   Dental Pain    Kimberly Fleming is a 38 y.o. female.  38 year old female who presents to the emergency department with dental pain.  Reports that 1 month ago she had her molars extracted on the right side.  Says that since then she has had persistent pain.  Thinks she may have a little bit of swelling as well.  Was seen by her dentist to evaluate for an abscess which she reports was not present.  Comes in again today to try and see if an abscess is formed now.  Has a dentist appointment tomorrow.       Home Medications Prior to Admission medications   Medication Sig Start Date End Date Taking? Authorizing Provider  chlorhexidine (PERIDEX) 0.12 % solution Use as directed 15 mLs in the mouth or throat 2 (two) times daily. 01/31/24  Yes Ninetta Basket, MD  clindamycin (CLEOCIN) 300 MG capsule Take 1 capsule (300 mg total) by mouth 4 (four) times daily for 5 days. 01/31/24 02/05/24 Yes Ninetta Basket, MD  acetaminophen  (TYLENOL ) 500 MG tablet Take 500 mg by mouth every 6 (six) hours as needed for moderate pain.    [provider]  butalbital -acetaminophen -caffeine  (FIORICET) 50-325-40 MG tablet Take 1 tablet by mouth every 6 (six) hours as needed for headache. 08/10/23 08/09/24  Almond Army, MD  fexofenadine  (ALLEGRA ) 60 MG tablet Take 1 tablet (60 mg total) by mouth 2 (two) times daily. 08/10/23   Almond Army, MD  fluticasone  (FLONASE ) 50 MCG/ACT nasal spray Place 1 spray into both nostrils daily. 08/10/23   Almond Army, MD  ibuprofen  (ADVIL ) 800 MG tablet Take 1 tablet (800 mg total) by mouth 3 (three) times daily. Patient not taking: Reported on 04/07/2023 02/17/23   Rising, Ivette Marks, PA-C  olopatadine  (PATANOL) 0.1 % ophthalmic solution Place 1 drop into both eyes 2 (two) times  daily. Patient not taking: Reported on 04/07/2023 02/17/23   Rising, Ivette Marks, PA-C      Allergies    Patient has no known allergies.    Review of Systems   Review of Systems  Physical Exam Updated Vital Signs BP (!) 140/102   Pulse 74   Temp 98.2 F (36.8 C) (Oral)   Resp 16   SpO2 100%  Physical Exam HENT:     Mouth/Throat:      Comments: Uvula: without deviation or edema. Uvula midline. Soft palate: without swelling Sublingual: normal appearance/no brawny edema or tongue elevation Tonsils: no erythema or exudates     ED Results / Procedures / Treatments   Labs (all labs ordered are listed, but only abnormal results are displayed) Labs Reviewed - No data to display  EKG None  Radiology No results found.  Procedures Procedures    Medications Ordered in ED Medications - No data to display  ED Course/ Medical Decision Making/ A&P                                 Medical Decision Making Risk Prescription drug management.   Kimberly Fleming is a 38 y.o. female with comorbidities that complicate the patient evaluation including recent dental infection presents emergency department with mouth pain  Initial Ddx:  Abscess, dry socket/alveolar osteitis, pericoronitis, dental carry,  Ludwig's angina  MDM/Course:  Patient presents emergency department with pain at the site of the dental extraction.  He was extracted approximately a month ago.  On exam no abscess.  No signs of Ludwig angina.  Does have some pain near the gingival flaps.  Suspect that she either has alveolar osteitis or pericarditis.  Does have dental follow-up tomorrow.  Will treat her with Comycin and give her Peridex wash in the meantime.   This patient presents to the ED for concern of complaints listed in HPI, this involves an extensive number of treatment options, and is a complaint that carries with it a high risk of complications and morbidity. Disposition including potential need for admission  considered.   Dispo: DC Home. Return precautions discussed including, but not limited to, those listed in the AVS. Allowed pt time to ask questions which were answered fully prior to dc.  I have reviewed the patients home medications and made adjustments as needed  Portions of this note were generated with Dragon dictation software. Dictation errors may occur despite best attempts at proofreading.     Final Clinical Impression(s) / ED Diagnoses Final diagnoses:  Pericoronitis    Rx / DC Orders ED Discharge Orders          Ordered    clindamycin (CLEOCIN) 300 MG capsule  4 times daily        01/31/24 1015    chlorhexidine (PERIDEX) 0.12 % solution  2 times daily        01/31/24 1015              Ninetta Basket, MD 01/31/24 1026

## 2024-01-31 NOTE — Discharge Instructions (Signed)
 You were seen for your dental infection in the emergency department.   At home, please use the Peridex twice a day.  Please also use the oral antibiotic pills that you are prescribed.    Check your MyChart online for the results of any tests that had not resulted by the time you left the emergency department.   Follow-up with your dentist tomorrow.  Return immediately to the emergency department if you experience any of the following: Worsening pain, swelling under your tongue, difficulty breathing, or any other concerning symptoms.    Thank you for visiting our Emergency Department. It was a pleasure taking care of you today.

## 2024-02-03 ENCOUNTER — Other Ambulatory Visit: Payer: Self-pay

## 2024-02-03 ENCOUNTER — Emergency Department (HOSPITAL_COMMUNITY)
Admission: EM | Admit: 2024-02-03 | Discharge: 2024-02-03 | Disposition: A | Discharge status: Home / Self Care | Attending: Student | Admitting: Student

## 2024-02-03 DIAGNOSIS — R519 Headache, unspecified: Secondary | ICD-10-CM | POA: Insufficient documentation

## 2024-02-03 MED ORDER — NAPROXEN 500 MG PO TABS
500.0000 mg | ORAL_TABLET | Freq: Once | ORAL | Status: AC
  Administered 2024-02-03: 500 mg via ORAL
  Filled 2024-02-03: qty 1

## 2024-02-03 MED ORDER — GUAIFENESIN ER 600 MG PO TB12: 600.0000 mg | ORAL_TABLET | Freq: Two times a day (BID) | ORAL | 0 refills | Status: AC | PRN

## 2024-02-03 MED ORDER — NAPROXEN 375 MG PO TABS: 375.0000 mg | ORAL_TABLET | Freq: Two times a day (BID) | ORAL | 0 refills | Status: AC | PRN

## 2024-02-03 MED ORDER — FLUTICASONE PROPIONATE 50 MCG/ACT NA SUSP: 1.0000 | Freq: Every day | NASAL | 0 refills | Status: AC

## 2024-02-03 MED ORDER — GUAIFENESIN 100 MG/5ML PO LIQD
10.0000 mL | Freq: Once | ORAL | Status: AC
Start: 2024-02-03 — End: 2024-02-03
  Administered 2024-02-03: 10 mL via ORAL
  Filled 2024-02-03: qty 10

## 2024-02-03 NOTE — ED Triage Notes (Signed)
 Pt c/o headache with pain in forehead and bridge of nose for several days.

## 2024-02-03 NOTE — ED Provider Notes (Signed)
 [place] EMERGENCY DEPARTMENT AT [facility] Provider Note  CSN: [id number] Arrival date & time: 02/03/24 4098  Chief Complaint(s) Headache  HPI Kimberly Fleming is a 38 y.o. female who presents emergency room for evaluation of a headache.  States that she gets intermittent headaches that come and go.  Headache is frontal and along the distribution of the frontal sinuses.  No discharge from the nose.  Denies fever, chest pain, shortness of breath, abdominal pain, nausea, vomiting or other systemic symptoms.  Denies numbness, ting, weakness or other neurological plaints.   Past Medical History No past medical history on file. 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 Authorizing Provider  acetaminophen  (TYLENOL ) 500 MG tablet Take 500 mg by mouth every 6 (six) hours as needed for moderate pain.    [provider]  butalbital -acetaminophen -caffeine  (FIORICET) 50-325-40 MG tablet Take 1 tablet by mouth every 6 (six) hours as needed for headache. 08/10/23 08/09/24  Almond Army, MD  chlorhexidine  (PERIDEX ) 0.12 % solution Use as directed 15 mLs in the mouth or throat 2 (two) times daily. 01/31/24   Ninetta Basket, MD  clindamycin  (CLEOCIN ) 300 MG capsule Take 1 capsule (300 mg total) by mouth 4 (four) times daily for 5 days. 01/31/24 02/05/24  Ninetta Basket, MD  fexofenadine  (ALLEGRA ) 60 MG tablet Take 1 tablet (60 mg total) by mouth 2 (two) times daily. 08/10/23   Almond Army, MD  fluticasone  (FLONASE ) 50 MCG/ACT nasal spray Place 1 spray into both nostrils daily. 08/10/23   Almond Army, MD  ibuprofen  (ADVIL ) 800 MG tablet Take 1 tablet  (800 mg total) by mouth 3 (three) times daily. Patient not taking: Reported on 04/07/2023 02/17/23   Rising, Ivette Marks, PA-C  olopatadine  (PATANOL) 0.1 % ophthalmic solution Place 1 drop into both eyes 2 (two) times daily. Patient not taking: Reported on 04/07/2023 02/17/23   Rising, Beth Brooke                                                                                                                                    Past Surgical History Past Surgical History:  Procedure Laterality Date   CESAREAN SECTION     Family History No family history on file.  Social History Social History   Tobacco Use   Smoking status: Never   Smokeless tobacco: Never  Vaping Use   Vaping status: Never Used  Substance Use Topics   Alcohol use: Yes   Drug use: Never   Allergies Patient has no known allergies.  Review of Systems Review of Systems  Neurological:  Positive for headaches.  Physical Exam Vital Signs  I have reviewed the triage vital signs BP (!) 140/99 (BP Location: Right Arm)   Pulse 82   Temp 98.2 F (36.8 C) (Oral)   Resp 18   Ht 5\' 11"  (1.803 m)   Wt 93 kg   SpO2 100%   BMI 28.59 kg/m   Physical Exam Vitals and nursing note reviewed.  Constitutional:      General: She is not in acute distress.    Appearance: She is well-developed.  HENT:     Head: Normocephalic and atraumatic.  Eyes:     Conjunctiva/sclera: Conjunctivae normal.  Cardiovascular:     Rate and Rhythm: Normal rate and regular rhythm.     Heart sounds: No murmur heard. Pulmonary:     Effort: Pulmonary effort is normal. No respiratory distress.     Breath sounds: Normal breath sounds.  Abdominal:     Palpations: Abdomen is soft.     Tenderness: There is no abdominal tenderness.  Musculoskeletal:        General: No swelling.     Cervical back: Neck supple.  Skin:    General: Skin is warm and dry.     Capillary Refill: Capillary refill takes less than 2 seconds.  Neurological:     Mental  Status: She is alert.  Psychiatric:        Mood and Affect: Mood normal.     ED Results and Treatments Labs (all labs ordered are listed, but only abnormal results are displayed) Labs Reviewed - No data to display                                                                                                                        Radiology No results found.  Pertinent labs & imaging results that were available during my care of the patient were reviewed by me and considered in my medical decision making (see MDM for details).  Medications Ordered in ED Medications  guaiFENesin (ROBITUSSIN) 100 MG/5ML liquid 10 mL (has no administration in time range)  naproxen (NAPROSYN) tablet 500 mg (has no administration in time range)                                                                                                                                     Procedures Procedures  (including critical care time)  Medical Decision Making / ED Course  This patient presents to the ED for concern of headache, this involves an extensive number of treatment options, and is a complaint that carries with it a high risk of complications and morbidity.  The differential diagnosis includes migraine, Cluster, Tension Ha, Dural venous thrombosis, Sinusitis, CO poisoning, HTN, Malignancy  MDM: Patient seen for evaluation of a headache.  Physical exam with some mild tenderness over the frontal sinuses but is otherwise unremarkable.  There is no discharge coming from the sinuses.  Presentation consistent with tension headache and symptoms improved with Mucinex and Naprosyn.  She has a normal neurologic exam with no focal motor or sensory deficits and I have very low suspicion for acute intracranial pathology.  At this time she does not meet inpatient criteria for admission to be discharged to outpatient follow-up.  Return precautions given which she voiced understanding.   Additional history  obtained:  -External records from outside source obtained and reviewed including: Chart review including previous notes, labs, imaging, consultation notes   Medicines ordered and prescription drug management: Meds ordered this encounter  Medications   guaiFENesin (ROBITUSSIN) 100 MG/5ML liquid 10 mL   naproxen (NAPROSYN) tablet 500 mg    -I have reviewed the patients home medicines and have made adjustments as needed  Critical interventions none    Social Determinants of Health:  Factors impacting patients care include: none   Reevaluation: After the interventions noted above, I reevaluated the patient and found that they have :improved  Co morbidities that complicate the patient evaluation No past medical history on file.    Dispostion: I considered admission for this patient, but at this time she does not meet inpatient criteria for admission will be discharged with outpatient follow-up     Final Clinical Impression(s) / ED Diagnoses Final diagnoses:  None     @PCDICTATION @    Karlyn Overman, MD 02/03/24 [phone]

## 2024-02-08 ENCOUNTER — Emergency Department (HOSPITAL_COMMUNITY)
Admission: EM | Admit: 2024-02-08 | Discharge: 2024-02-08 | Disposition: A | Discharge status: Home / Self Care | Attending: Emergency Medicine | Admitting: Emergency Medicine

## 2024-02-08 ENCOUNTER — Other Ambulatory Visit: Payer: Self-pay

## 2024-02-08 ENCOUNTER — Emergency Department (HOSPITAL_COMMUNITY)

## 2024-02-08 ENCOUNTER — Encounter (HOSPITAL_COMMUNITY): Payer: Self-pay

## 2024-02-08 DIAGNOSIS — R519 Headache, unspecified: Secondary | ICD-10-CM | POA: Insufficient documentation

## 2024-02-08 LAB — CBC WITH DIFFERENTIAL/PLATELET
Abs Immature Granulocytes: 0.01 10*3/uL (ref 0.00–0.07)
Basophils Absolute: 0.1 10*3/uL (ref 0.0–0.1)
Basophils Relative: 1 %
Eosinophils Absolute: 0.4 10*3/uL (ref 0.0–0.5)
Eosinophils Relative: 6 %
HCT: 44.5 % (ref 36.0–46.0)
Hemoglobin: 13.9 g/dL (ref 12.0–15.0)
Immature Granulocytes: 0 %
Lymphocytes Relative: 42 %
Lymphs Abs: 2.6 10*3/uL (ref 0.7–4.0)
MCH: 27.9 pg (ref 26.0–34.0)
MCHC: 31.2 g/dL (ref 30.0–36.0)
MCV: 89.2 fL (ref 80.0–100.0)
Monocytes Absolute: 0.4 10*3/uL (ref 0.1–1.0)
Monocytes Relative: 7 %
Neutro Abs: 2.7 10*3/uL (ref 1.7–7.7)
Neutrophils Relative %: 44 %
Platelets: 245 10*3/uL (ref 150–400)
RBC: 4.99 MIL/uL (ref 3.87–5.11)
RDW: 12.7 % (ref 11.5–15.5)
WBC: 6.2 10*3/uL (ref 4.0–10.5)
nRBC: 0 % (ref 0.0–0.2)

## 2024-02-08 LAB — COMPREHENSIVE METABOLIC PANEL WITH GFR
ALT: 19 U/L (ref 0–44)
AST: 16 U/L (ref 15–41)
Albumin: 4.3 g/dL (ref 3.5–5.0)
Alkaline Phosphatase: 70 U/L (ref 38–126)
Anion gap: 8 (ref 5–15)
BUN: 13 mg/dL (ref 6–20)
CO2: 26 mmol/L (ref 22–32)
Calcium: 9.4 mg/dL (ref 8.9–10.3)
Chloride: 104 mmol/L (ref 98–111)
Creatinine, Ser: 0.81 mg/dL (ref 0.44–1.00)
GFR, Estimated: >60 mL/min (ref >60)
Glucose, Bld: 109 mg/dL — ABNORMAL HIGH (ref 70–99)
Potassium: 4 mmol/L (ref 3.5–5.1)
Sodium: 138 mmol/L (ref 135–145)
Total Bilirubin: 0.7 mg/dL (ref 0.0–1.2)
Total Protein: 8.1 g/dL (ref 6.5–8.1)

## 2024-02-08 LAB — HCG, SERUM, QUALITATIVE: Preg, Serum: NEGATIVE

## 2024-02-08 MED ORDER — MECLIZINE HCL 25 MG PO TABS: 25.0000 mg | ORAL_TABLET | Freq: Three times a day (TID) | ORAL | 0 refills | Status: AC | PRN

## 2024-02-08 MED ORDER — MECLIZINE HCL 25 MG PO TABS
25.0000 mg | ORAL_TABLET | Freq: Once | ORAL | Status: AC
  Administered 2024-02-08: 25 mg via ORAL
  Filled 2024-02-08: qty 1

## 2024-02-08 MED ORDER — PROCHLORPERAZINE MALEATE 10 MG PO TABS: 10.0000 mg | ORAL_TABLET | Freq: Two times a day (BID) | ORAL | 0 refills | Status: AC | PRN

## 2024-02-08 MED ORDER — GADOBUTROL 1 MMOL/ML IV SOLN
9.0000 mL | Freq: Once | INTRAVENOUS | Status: AC | PRN
  Administered 2024-02-08: 9 mL via INTRAVENOUS

## 2024-02-08 NOTE — ED Notes (Signed)
 MRI tech at bedside.

## 2024-02-08 NOTE — ED Provider Notes (Signed)
  EMERGENCY DEPARTMENT AT [facility] Provider Note   CSN: [id number] Arrival date & time: 02/08/24  2841     History  Chief Complaint  Patient presents with   Headache    Kimberly Fleming is a 38 y.o. female.  HPI     Began having headaches in November, initially improved  but over the last week has worsened  Headache been coming and going on since around 5/9, like bad sinus headache  A few months ago had CT for same thing  Went to Dr. Denyse Flasher it could be sinuses  Pressure on nose and behind eyes, then headache, pressure across the forehead  Now when walking feeling like going to the side, feel off balance when standing and when sit down feel like she is moving since Saturday  No numbness or focal weakness, no difficulty talking, no double vision, no vision changes Heaviness of lids, pressure  Had teeth pulled end of march, not eating like used to, is worrying but getting back on track. Just feels off balance Has not seen neurology Headache used to be worse with bright lights loud sounds but now not  Mother passed away from dental infection that had traveled to her brain She recently also had dental work done  History reviewed. No pertinent past medical history.   Home Medications Prior to Admission medications   Medication Sig Start Date End Date Taking? Authorizing Provider  meclizine (ANTIVERT) 25 MG tablet Take 1 tablet (25 mg total) by mouth 3 (three) times daily as needed for dizziness. 02/08/24  Yes Scarlette Currier, MD  prochlorperazine (COMPAZINE) 10 MG tablet Take 1 tablet (10 mg total) by mouth 2 (two) times daily as needed for nausea or vomiting (or headache). 02/08/24  Yes Scarlette Currier, MD  acetaminophen  (TYLENOL ) 500 MG tablet Take 500 mg by mouth every 6 (six) hours as needed for moderate pain.    [provider]  butalbital -acetaminophen -caffeine  (FIORICET) 50-325-40 MG tablet Take 1 tablet by mouth every 6 (six) hours as  needed for headache. 08/10/23 08/09/24  Almond Army, MD  chlorhexidine  (PERIDEX ) 0.12 % solution Use as directed 15 mLs in the mouth or throat 2 (two) times daily. 01/31/24   Ninetta Basket, MD  fexofenadine  (ALLEGRA ) 60 MG tablet Take 1 tablet (60 mg total) by mouth 2 (two) times daily. 08/10/23   Almond Army, MD  fluticasone  (FLONASE ) 50 MCG/ACT nasal spray Place 1 spray into both nostrils daily. 08/10/23   Almond Army, MD  fluticasone  (FLONASE ) 50 MCG/ACT nasal spray Place 1 spray into both nostrils daily. 02/03/24 03/04/24  Kommor, Madison, MD  guaiFENesin (MUCINEX) 600 MG 12 hr tablet Take 1 tablet (600 mg total) by mouth 2 (two) times daily as needed (for pressure in the head). 02/03/24 03/04/24  Kommor, Madison, MD  ibuprofen  (ADVIL ) 800 MG tablet Take 1 tablet (800 mg total) by mouth 3 (three) times daily. Patient not taking: Reported on 04/07/2023 02/17/23   Rising, Ivette Marks, PA-C  naproxen (NAPROSYN) 375 MG tablet Take 1 tablet (375 mg total) by mouth 2 (two) times daily as needed for headache. 02/03/24   Kommor, Madison, MD  olopatadine  (PATANOL) 0.1 % ophthalmic solution Place 1 drop into both eyes 2 (two) times daily. Patient not taking: Reported on 04/07/2023 02/17/23   Rising, Ivette Marks, PA-C      Allergies    Patient has no known allergies.    Review of Systems   Review of Systems  Physical Exam Updated Vital Signs  BP (!) 139/104 (BP Location: Left Arm)   Pulse 88   Temp 98.1 F (36.7 C) (Oral)   Resp 16   Ht 5\' 11"  (1.803 m)   Wt 92 kg   SpO2 100%   BMI 28.29 kg/m  Physical Exam Vitals and nursing note reviewed.  Constitutional:      General: She is not in acute distress.    Appearance: Normal appearance. She is well-developed. She is not ill-appearing or diaphoretic.  HENT:     Head: Normocephalic and atraumatic.  Eyes:     General: No visual field deficit.    Extraocular Movements: Extraocular movements intact.     Conjunctiva/sclera: Conjunctivae  normal.     Pupils: Pupils are equal, round, and reactive to light.  Cardiovascular:     Rate and Rhythm: Normal rate and regular rhythm.     Pulses: Normal pulses.     Heart sounds: Normal heart sounds. No murmur heard.    No friction rub. No gallop.  Pulmonary:     Effort: Pulmonary effort is normal. No respiratory distress.     Breath sounds: Normal breath sounds.  Abdominal:     General: There is no distension.     Palpations: Abdomen is soft.     Tenderness: There is no abdominal tenderness. There is no guarding.  Musculoskeletal:        General: No swelling or tenderness.     Cervical back: Normal range of motion.  Skin:    General: Skin is warm and dry.     Findings: No erythema or rash.  Neurological:     General: No focal deficit present.     Mental Status: She is alert and oriented to person, place, and time.     GCS: GCS eye subscore is 4. GCS verbal subscore is 5. GCS motor subscore is 6.     Cranial Nerves: No cranial nerve deficit, dysarthria or facial asymmetry.     Sensory: No sensory deficit.     Motor: No weakness or tremor.     Coordination: Coordination normal. Finger-Nose-Finger Test normal.     Gait: Gait normal.     Comments: Right beating horizontal nystagmus     ED Results / Procedures / Treatments   Labs (all labs ordered are listed, but only abnormal results are displayed) Labs Reviewed  COMPREHENSIVE METABOLIC PANEL WITH GFR - Abnormal; Notable for the following components:      Result Value   Glucose, Bld 109 (*)    All other components within normal limits  CBC WITH DIFFERENTIAL/PLATELET  HCG, SERUM, QUALITATIVE    EKG EKG Interpretation Date/Time:  Wednesday Feb 08 2024 09:19:47 EDT Ventricular Rate:  88 PR Interval:  159 QRS Duration:  100 QT Interval:  379 QTC Calculation: 459 R Axis:   72  Text Interpretation: Sinus rhythm Borderline T abnormalities, anterior leads No previous ECGs available Confirmed by Scarlette Currier ([id number])  on 02/08/2024 12:17:07 PM  Radiology MR MRV HEAD W WO CONTRAST Result Date: 02/08/2024 CLINICAL DATA:  38 year old female with headache, loss of balance, sinusitis. EXAM: MR VENOGRAM HEAD WITHOUT AND WITH CONTRAST TECHNIQUE: Angiographic images of the intracranial venous structures were acquired using MRV technique without and with intravenous contrast. CONTRAST:  9mL GADAVIST GADOBUTROL 1 MMOL/ML IV SOLN COMPARISON:  Brain MRI today reported separately. FINDINGS: On precontrast time-of-flight images there is maintained flow signal in the superior sagittal sinus, torcula, straight sinus, vein of Galen, internal cerebral veins, basal veins of Rosenthal, and  in bilateral but left larger than right transverse sinuses, sigmoid sinuses, IJ bulbs. On the comparison MRI today the left transverse and sigmoid sinuses appear dominant. Following contrast there is normal enhancement of those structures, plus the bilateral cavernous sinus. Also the major draining cortical veins including Trolard and Labbe demonstrate maintained signal and enhancement. IMPRESSION: Normal intracranial MRV, negative for dural venous sinus thrombosis. Electronically Signed   By: Marlise Simpers M.D.   On: 02/08/2024 11:59   MR Brain W and Wo Contrast Result Date: 02/08/2024 CLINICAL DATA:  38 year old female with headache, loss of balance, sinusitis. EXAM: MRI HEAD WITHOUT AND WITH CONTRAST TECHNIQUE: Multiplanar, multiecho pulse sequences of the brain and surrounding structures were obtained without and with intravenous contrast. CONTRAST:  9mL GADAVIST GADOBUTROL 1 MMOL/ML IV SOLN COMPARISON:  Head CT 08/10/2023 and intracranial MRV today reported separately. FINDINGS: Brain: Normal cerebral volume. Midline structures are normally formed. No restricted diffusion to suggest acute infarction. No midline shift, mass effect, evidence of mass lesion, ventriculomegaly, extra-axial collection or acute intracranial hemorrhage. Cervicomedullary junction and  pituitary are within normal limits. Largely normal for age gray and Dewald matter signal throughout the brain. Minimal nonspecific periatrial Kutter matter T2 and FLAIR hyperintensity (series 9, image 30). No cortical encephalomalacia. No chronic cerebral blood products on SWI. No abnormal enhancement identified. No dural thickening. Vascular: Major intracranial vascular flow voids are preserved. MRV is detailed separately today. Skull and upper cervical spine: Visualized bone marrow signal is within normal limits. Negative visible cervical spine except for evidence of chronic disc and endplate degeneration at C5-C6. Sinuses/Orbits: Negative orbits. Paranasal sinuses and mastoids are stable and well aerated. Other: Visible internal auditory structures appear normal. Negative visible scalp and face. IMPRESSION: 1. No acute intracranial abnormality. Largely normal for age MRI appearance of the Brain; minimal nonspecific Rundell matter signal change. 2. Intracranial MRV reported separately. Electronically Signed   By: Marlise Simpers M.D.   On: 02/08/2024 11:56    Procedures Procedures    Medications Ordered in ED Medications  gadobutrol (GADAVIST) 1 MMOL/ML injection 9 mL (9 mLs Intravenous Contrast Given 02/08/24 1150)  meclizine (ANTIVERT) tablet 25 mg (25 mg Oral Given 02/08/24 1226)    ED Course/ Medical Decision Making/ A&P                                  38 year old female with no significant medical history, with development of headaches in November which had improved and returned in the last week presents with concern for headache and feeling off balance.  Low suspicion for meningitis or encephalitis given duration of symptoms, afebrile.  No trauma to suggest subdural hematoma.  Slow onset of symptoms and have low suspicion for acute subarachnoid hemorrhage.  Given her persistence of headache and development of difficulty ambulating, will obtain MRI brain with and without and MRV to evaluate for  complications of sinusitis such as dural venous thrombosis. Will also obtain labs to evaluate for signs of anemia or other electrolyte abnormalities which may contribute to her sensation of being off balance.   Labs completed and evaluated by me show no clinically significant anemia, electrolyte abnormalities. Negative pregnancy test. ECG without acute abnormalities no arrhythmia. No chest pain or dyspnea doubt ACS or PE.  MR brain WWO/MRV completed showing no acute intracranial abnormality, no sign of CVT.  Discussed symptoms may be due to tension headaches. Recommend follow up with PCP.  Given meclizine  for possible inner ear vertigo causing sensation of being off balance and rx to use if she feels improvement. Recommend follow up with PCP. Patient discharged in stable condition with understanding of reasons to return.         Final Clinical Impression(s) / ED Diagnoses Final diagnoses:  Acute nonintractable headache, unspecified headache type    Rx / DC Orders ED Discharge Orders          Ordered    prochlorperazine (COMPAZINE) 10 MG tablet  2 times daily PRN        02/08/24 1220    meclizine (ANTIVERT) 25 MG tablet  3 times daily PRN        02/08/24 1220              Scarlette Currier, MD 02/08/24 2239

## 2024-02-08 NOTE — ED Triage Notes (Signed)
 Patient said she has felt off since Monday. Has a headache. Said she walks more to the right side. Feels off balanced when she stands up. Has sinus pressure.

## 2024-02-08 NOTE — ED Notes (Signed)
 Patient back from MRI per wheelchair.

## 2024-09-17 ENCOUNTER — Encounter: Payer: Self-pay | Admitting: "Endocrinology

## 2024-09-17 ENCOUNTER — Ambulatory Visit: Admitting: "Endocrinology

## 2024-09-17 VITALS — BP 116/88 | HR 88 | Ht 71.0 in | Wt 219.0 lb

## 2024-09-17 DIAGNOSIS — R946 Abnormal results of thyroid function studies: Secondary | ICD-10-CM | POA: Diagnosis not present

## 2024-09-17 DIAGNOSIS — E66811 Obesity, class 1: Secondary | ICD-10-CM

## 2024-09-17 DIAGNOSIS — E6609 Other obesity due to excess calories: Secondary | ICD-10-CM

## 2024-09-17 DIAGNOSIS — Z6830 Body mass index (BMI) 30.0-30.9, adult: Secondary | ICD-10-CM

## 2024-09-17 NOTE — Progress Notes (Signed)
 "    Outpatient Endocrinology Note Kimberly Birmingham, MD  09/17/2024   Kimberly Fleming [birth year] [id number]  Referring Provider: Desiree Quale, NP Primary Care Provider: Department, [facility] Subjective  No chief complaint on file.   Assessment & Plan  Diagnoses and all orders for this visit:  Abnormal thyroid uptake  Class 1 obesity due to excess calories with body mass index (BMI) of 30.0 to 30.9 in adult, unspecified whether serious comorbidity present    Kimberly Fleming is currently taking no thyroid medication. Patient was referred to us  after her T3 uptake was found to be mildly elevated at 39 (cutoff 35) when her labs were checked on 02/06/2024 labs. Her TSH, free T4 and T4 total were normal.  Patient is currently biochemically euthyroid.  Educated on thyroid axis.  Recommend the following: continue annual TSH as part of preventive labs with PCP.  No known significance in this case of the T3 uptake being elevated mildly given that her TSH and free T4 are normal. Repeat lab before next visit or sooner if symptoms of hyperthyroidism or hypothyroidism develop.  Notify us  immediately in case of significant weight gain or loss. Counseled on compliance and follow up needs.  Patient inquired about her weight, discussed that she was in grade 1 obesity at this time-unrelated to her thyroid functioning as of 01/2024 when thyroid labs were last checked Discussed lifestyle changes Patient given handout for calorie counting free phone apps Maintain healthy lifestyle including 30 min of activity/day, avoiding refined/processed/outside food 20 minutes physical activity per day, in continuum or interruptedly through the day  Goals: less than 60 grams of carbohydrate/meal, 1200-1500 Cal/day, 10,0000 steps a day and weight loss of 0.5-1 lb/ wk  Sleep 7-9 hours/day, adapt good sleep hygiene Adapt de-stressing and relaxation techniques to prevent stress induced weight  gain Avoid/switch medications that lead to weight gain by discussing with the prescribing physician    I have reviewed current medications, nurse's notes, allergies, vital signs, past medical and surgical history, family medical history, and social history for this encounter. Counseled patient on symptoms, examination findings, lab findings, imaging results, treatment decisions and monitoring and prognosis. The patient understood the recommendations and agrees with the treatment plan. All questions regarding treatment plan were fully answered.   No follow-ups on file.   Kimberly Birmingham, MD  09/17/2024   I have reviewed current medications, nurse's notes, allergies, vital signs, past medical and surgical history, family medical history, and social history for this encounter. Counseled patient on symptoms, examination findings, lab findings, imaging results, treatment decisions and monitoring and prognosis. The patient understood the recommendations and agrees with the treatment plan. All questions regarding treatment plan were fully answered.   History of Present Illness Kimberly Fleming is a 38 y.o. year old female who presents to our clinic with elevated T3 update diagnosed in 01/2024.    Patient was referred to us  after her T3 uptake was found to be mildly elevated at 39 (cutoff 35) when her labs were checked on 02/06/2024 labs. Her TSH, free T4 and T4 total were normal.   Never been on any thyroid medication  No thyroid disease runs in the family   Symptoms suggestive of HYPOTHYROIDISM:  fatigue Yes weight gain Yes cold intolerance  No constipation  Yes  Symptoms suggestive of HYPERTHYROIDISM:  weight loss  No heat intolerance No hyperdefecation  No palpitations  No  Compressive symptoms:  dysphagia  No dysphonia  No positional dyspnea (especially with  simultaneous arms elevation)  No  Smokes  No On biotin  No Personal history of head/neck surgery/irradiation   No    Physical Exam  BP 116/88   Pulse 88   Ht 5' 11 (1.803 m)   Wt 219 lb (99.3 kg)   SpO2 96%   BMI 30.54 kg/m  Constitutional: well developed, well nourished Head: normocephalic, atraumatic, no exophthalmos Eyes: sclera anicteric, no redness Neck: no thyromegaly, no thyroid tenderness; no nodules palpated Lungs: normal respiratory effort Neurology: alert and oriented, no fine hand tremor Skin: dry, no appreciable rashes Musculoskeletal: no appreciable defects Psychiatric: normal mood and affect  Allergies Allergies[1]  Current Medications Patient's Medications  New Prescriptions   No medications on file  Previous Medications   ACETAMINOPHEN  (TYLENOL ) 500 MG TABLET    Take 500 mg by mouth every 6 (six) hours as needed for moderate pain.   CHLORHEXIDINE  (PERIDEX ) 0.12 % SOLUTION    Use as directed 15 mLs in the mouth or throat 2 (two) times daily.   FEXOFENADINE  (ALLEGRA ) 60 MG TABLET    Take 1 tablet (60 mg total) by mouth 2 (two) times daily.   FLUTICASONE  (FLONASE ) 50 MCG/ACT NASAL SPRAY    Place 1 spray into both nostrils daily.   FLUTICASONE  (FLONASE ) 50 MCG/ACT NASAL SPRAY    Place 1 spray into both nostrils daily.   IBUPROFEN  (ADVIL ) 800 MG TABLET    Take 1 tablet (800 mg total) by mouth 3 (three) times daily.   MECLIZINE  (ANTIVERT ) 25 MG TABLET    Take 1 tablet (25 mg total) by mouth 3 (three) times daily as needed for dizziness.   NAPROXEN  (NAPROSYN ) 375 MG TABLET    Take 1 tablet (375 mg total) by mouth 2 (two) times daily as needed for headache.   OLOPATADINE  (PATANOL) 0.1 % OPHTHALMIC SOLUTION    Place 1 drop into both eyes 2 (two) times daily.   PROCHLORPERAZINE  (COMPAZINE ) 10 MG TABLET    Take 1 tablet (10 mg total) by mouth 2 (two) times daily as needed for nausea or vomiting (or headache).  Modified Medications   No medications on file  Discontinued Medications   No medications on file    Past Medical History History reviewed. No pertinent past  medical history.  Past Surgical History Past Surgical History:  Procedure Laterality Date   CESAREAN SECTION      Family History family history is not on file.  Social History Social History   Socioeconomic History   Marital status: Single    Spouse name: Not on file   Number of children: Not on file   Years of education: Not on file   Highest education level: Not on file  Occupational History   Not on file  Tobacco Use   Smoking status: Never   Smokeless tobacco: Never  Vaping Use   Vaping status: Never Used  Substance and Sexual Activity   Alcohol use: Yes   Drug use: Never   Sexual activity: Not on file  Other Topics Concern   Not on file  Social History Narrative   Not on file   Social Drivers of Health   Tobacco Use: Low Risk (09/17/2024)   Patient History    Smoking Tobacco Use: Never    Smokeless Tobacco Use: Never    Passive Exposure: Not on file  Financial Resource Strain: Not on file  Food Insecurity: Not on file  Transportation Needs: Not on file  Physical Activity: Not on file  Stress:  Not on file  Social Connections: Unknown (02/19/2023)   Received from [facility]   Social Network    Social Network: Not on file  Intimate Partner Violence: Unknown (02/19/2023)   Received from [facility]   HITS    Physically Hurt: Not on file    Insult or Talk Down To: Not on file    Threaten Physical Harm: Not on file    Scream or Curse: Not on file  Depression (PHQ2-9): Not on file  Alcohol Screen: Not on file  Housing: Not on file  Utilities: Not on file  Health Literacy: Not on file    Laboratory Investigations No results found for: TSH, FREET4   No results found for: TSI   No components found for: TRAB   Lab Results  Component Value Date   CHOL 139 08/08/2007   Lab Results  Component Value Date   HDL 59 08/08/2007   Lab Results  Component Value Date   LDLCALC 71 08/08/2007   Lab Results  Component Value Date   TRIG 43  08/08/2007   Lab Results  Component Value Date   CHOLHDL 2.4 Ratio 08/08/2007   Lab Results  Component Value Date   CREATININE 0.81 02/08/2024   No results found for: GFR    Component Value Date/Time   NA 138 02/08/2024 0905   K 4.0 02/08/2024 0905   CL 104 02/08/2024 0905   CO2 26 02/08/2024 0905   GLUCOSE 109 (H) 02/08/2024 0905   BUN 13 02/08/2024 0905   CREATININE 0.81 02/08/2024 0905   CALCIUM 9.4 02/08/2024 0905   PROT 8.1 02/08/2024 0905   ALBUMIN 4.3 02/08/2024 0905   AST 16 02/08/2024 0905   ALT 19 02/08/2024 0905   ALKPHOS 70 02/08/2024 0905   BILITOT 0.7 02/08/2024 0905   GFRNONAA >60 02/08/2024 0905      Latest Ref Rng & Units 02/08/2024    9:05 AM 08/10/2023   10:08 AM  BMP  Glucose 70 - 99 mg/dL 890  868   BUN 6 - 20 mg/dL 13  15   Creatinine 9.55 - 1.00 mg/dL 9.18  9.27   Sodium 864 - 145 mmol/L 138  135   Potassium 3.5 - 5.1 mmol/L 4.0  3.5   Chloride 98 - 111 mmol/L 104  104   CO2 22 - 32 mmol/L 26  24   Calcium 8.9 - 10.3 mg/dL 9.4  8.6        Component Value Date/Time   WBC 6.2 02/08/2024 0905   RBC 4.99 02/08/2024 0905   HGB 13.9 02/08/2024 0905   HCT 44.5 02/08/2024 0905   PLT 245 02/08/2024 0905   MCV 89.2 02/08/2024 0905   MCH 27.9 02/08/2024 0905   MCHC 31.2 02/08/2024 0905   RDW 12.7 02/08/2024 0905   LYMPHSABS 2.6 02/08/2024 0905   MONOABS 0.4 02/08/2024 0905   EOSABS 0.4 02/08/2024 0905   BASOSABS 0.1 02/08/2024 0905      Parts of this note may have been dictated using voice recognition software. There may be variances in spelling and vocabulary which are unintentional. Not all errors are proofread. Please notify the dino if any discrepancies are noted or if the meaning of any statement is not clear.       [1] No Known Allergies  "

## 2024-09-17 NOTE — Patient Instructions (Addendum)
 Pt interested in weight loss Discussed lifestyle changes, medical management as well as bariatric surgery Maintain healthy lifestyle including 30 min of activity/day, avoiding refined/processed/outside food 20 minutes physical activity per day, in continuum or interruptedly through the day  Goals: less than 60 grams of carbohydrate/meal, 1200-1500 Cal/day, 10,0000 steps a day and weight loss of 0.5-1 lb/ wk  Sleep 7-9 hours/day, adapt good sleep hygiene Adapt de-stressing and relaxation techniques to prevent stress induced weight gain Avoid/switch medications that lead to weight gain by discussing with the prescribing physician
